# Patient Record
Sex: Male | Born: 1965 | Race: White | Hispanic: No | Marital: Married | Smoking: Never smoker
Health system: Southern US, Community
[De-identification: ages and names within clinical notes are randomized; demographics above are authoritative.]

## PROBLEM LIST (undated history)

## (undated) DIAGNOSIS — E785 Hyperlipidemia, unspecified: Secondary | ICD-10-CM

## (undated) DIAGNOSIS — I10 Essential (primary) hypertension: Secondary | ICD-10-CM

## (undated) HISTORY — PX: TESTICLE TORSION REDUCTION: SHX795

## (undated) HISTORY — DX: Hyperlipidemia, unspecified: E78.5

## (undated) HISTORY — DX: Essential (primary) hypertension: I10

---

## 2000-11-14 ENCOUNTER — Other Ambulatory Visit: Admission: RE | Admit: 2000-11-14 | Discharge: 2000-11-14 | Payer: Self-pay | Admitting: Urology

## 2004-12-06 ENCOUNTER — Encounter: Admission: RE | Admit: 2004-12-06 | Discharge: 2004-12-06 | Payer: Self-pay | Admitting: Family Medicine

## 2016-04-24 DIAGNOSIS — I129 Hypertensive chronic kidney disease with stage 1 through stage 4 chronic kidney disease, or unspecified chronic kidney disease: Secondary | ICD-10-CM | POA: Diagnosis not present

## 2016-04-24 DIAGNOSIS — E291 Testicular hypofunction: Secondary | ICD-10-CM | POA: Diagnosis not present

## 2016-04-24 DIAGNOSIS — K219 Gastro-esophageal reflux disease without esophagitis: Secondary | ICD-10-CM | POA: Diagnosis not present

## 2016-04-24 DIAGNOSIS — E7800 Pure hypercholesterolemia, unspecified: Secondary | ICD-10-CM | POA: Diagnosis not present

## 2016-04-24 DIAGNOSIS — N183 Chronic kidney disease, stage 3 (moderate): Secondary | ICD-10-CM | POA: Diagnosis not present

## 2016-11-25 DIAGNOSIS — E7800 Pure hypercholesterolemia, unspecified: Secondary | ICD-10-CM | POA: Diagnosis not present

## 2016-11-25 DIAGNOSIS — E291 Testicular hypofunction: Secondary | ICD-10-CM | POA: Diagnosis not present

## 2016-11-25 DIAGNOSIS — I129 Hypertensive chronic kidney disease with stage 1 through stage 4 chronic kidney disease, or unspecified chronic kidney disease: Secondary | ICD-10-CM | POA: Diagnosis not present

## 2016-11-25 DIAGNOSIS — Z0000 Encounter for general adult medical examination without abnormal findings: Secondary | ICD-10-CM | POA: Diagnosis not present

## 2016-11-25 DIAGNOSIS — Z125 Encounter for screening for malignant neoplasm of prostate: Secondary | ICD-10-CM | POA: Diagnosis not present

## 2017-01-27 DIAGNOSIS — L259 Unspecified contact dermatitis, unspecified cause: Secondary | ICD-10-CM | POA: Diagnosis not present

## 2017-05-30 DIAGNOSIS — E7800 Pure hypercholesterolemia, unspecified: Secondary | ICD-10-CM | POA: Diagnosis not present

## 2017-05-30 DIAGNOSIS — N183 Chronic kidney disease, stage 3 (moderate): Secondary | ICD-10-CM | POA: Diagnosis not present

## 2017-05-30 DIAGNOSIS — K219 Gastro-esophageal reflux disease without esophagitis: Secondary | ICD-10-CM | POA: Diagnosis not present

## 2017-05-30 DIAGNOSIS — I129 Hypertensive chronic kidney disease with stage 1 through stage 4 chronic kidney disease, or unspecified chronic kidney disease: Secondary | ICD-10-CM | POA: Diagnosis not present

## 2017-09-11 DIAGNOSIS — J069 Acute upper respiratory infection, unspecified: Secondary | ICD-10-CM | POA: Diagnosis not present

## 2017-10-22 DIAGNOSIS — J111 Influenza due to unidentified influenza virus with other respiratory manifestations: Secondary | ICD-10-CM | POA: Diagnosis not present

## 2017-10-22 DIAGNOSIS — R509 Fever, unspecified: Secondary | ICD-10-CM | POA: Diagnosis not present

## 2017-12-25 DIAGNOSIS — Z125 Encounter for screening for malignant neoplasm of prostate: Secondary | ICD-10-CM | POA: Diagnosis not present

## 2017-12-25 DIAGNOSIS — E7800 Pure hypercholesterolemia, unspecified: Secondary | ICD-10-CM | POA: Diagnosis not present

## 2017-12-25 DIAGNOSIS — E291 Testicular hypofunction: Secondary | ICD-10-CM | POA: Diagnosis not present

## 2017-12-25 DIAGNOSIS — I129 Hypertensive chronic kidney disease with stage 1 through stage 4 chronic kidney disease, or unspecified chronic kidney disease: Secondary | ICD-10-CM | POA: Diagnosis not present

## 2017-12-25 DIAGNOSIS — N183 Chronic kidney disease, stage 3 (moderate): Secondary | ICD-10-CM | POA: Diagnosis not present

## 2017-12-25 DIAGNOSIS — Z0000 Encounter for general adult medical examination without abnormal findings: Secondary | ICD-10-CM | POA: Diagnosis not present

## 2018-06-29 ENCOUNTER — Other Ambulatory Visit: Payer: Self-pay | Admitting: Family Medicine

## 2018-06-29 DIAGNOSIS — K219 Gastro-esophageal reflux disease without esophagitis: Secondary | ICD-10-CM | POA: Diagnosis not present

## 2018-06-29 DIAGNOSIS — E0789 Other specified disorders of thyroid: Secondary | ICD-10-CM

## 2018-06-29 DIAGNOSIS — E7800 Pure hypercholesterolemia, unspecified: Secondary | ICD-10-CM | POA: Diagnosis not present

## 2018-06-29 DIAGNOSIS — Z23 Encounter for immunization: Secondary | ICD-10-CM | POA: Diagnosis not present

## 2018-06-29 DIAGNOSIS — N183 Chronic kidney disease, stage 3 (moderate): Secondary | ICD-10-CM | POA: Diagnosis not present

## 2018-06-29 DIAGNOSIS — I129 Hypertensive chronic kidney disease with stage 1 through stage 4 chronic kidney disease, or unspecified chronic kidney disease: Secondary | ICD-10-CM | POA: Diagnosis not present

## 2018-07-10 ENCOUNTER — Ambulatory Visit
Admission: RE | Admit: 2018-07-10 | Discharge: 2018-07-10 | Disposition: A | Discharge status: Home / Self Care | Payer: BLUE CROSS/BLUE SHIELD | Source: Ambulatory Visit | Attending: Family Medicine | Admitting: Family Medicine

## 2018-07-10 DIAGNOSIS — E010 Iodine-deficiency related diffuse (endemic) goiter: Secondary | ICD-10-CM | POA: Diagnosis not present

## 2018-07-10 DIAGNOSIS — E042 Nontoxic multinodular goiter: Secondary | ICD-10-CM | POA: Diagnosis not present

## 2018-07-10 DIAGNOSIS — E0789 Other specified disorders of thyroid: Secondary | ICD-10-CM

## 2018-07-22 ENCOUNTER — Other Ambulatory Visit: Payer: Self-pay | Admitting: Family Medicine

## 2018-07-22 DIAGNOSIS — R9389 Abnormal findings on diagnostic imaging of other specified body structures: Secondary | ICD-10-CM

## 2018-08-18 ENCOUNTER — Ambulatory Visit
Admission: RE | Admit: 2018-08-18 | Discharge: 2018-08-18 | Disposition: A | Discharge status: Home / Self Care | Payer: BLUE CROSS/BLUE SHIELD | Source: Ambulatory Visit | Attending: Family Medicine | Admitting: Family Medicine

## 2018-08-18 ENCOUNTER — Other Ambulatory Visit (HOSPITAL_COMMUNITY)
Admission: RE | Admit: 2018-08-18 | Discharge: 2018-08-18 | Disposition: A | Discharge status: Home / Self Care | Payer: BLUE CROSS/BLUE SHIELD | Source: Ambulatory Visit | Attending: Radiology | Admitting: Radiology

## 2018-08-18 DIAGNOSIS — E041 Nontoxic single thyroid nodule: Secondary | ICD-10-CM | POA: Insufficient documentation

## 2018-08-18 DIAGNOSIS — E042 Nontoxic multinodular goiter: Secondary | ICD-10-CM | POA: Diagnosis not present

## 2018-08-18 DIAGNOSIS — R9389 Abnormal findings on diagnostic imaging of other specified body structures: Secondary | ICD-10-CM

## 2018-10-19 DIAGNOSIS — E291 Testicular hypofunction: Secondary | ICD-10-CM | POA: Diagnosis not present

## 2019-03-08 DIAGNOSIS — Z6831 Body mass index (BMI) 31.0-31.9, adult: Secondary | ICD-10-CM | POA: Diagnosis not present

## 2019-03-08 DIAGNOSIS — I129 Hypertensive chronic kidney disease with stage 1 through stage 4 chronic kidney disease, or unspecified chronic kidney disease: Secondary | ICD-10-CM | POA: Diagnosis not present

## 2019-03-08 DIAGNOSIS — Z0000 Encounter for general adult medical examination without abnormal findings: Secondary | ICD-10-CM | POA: Diagnosis not present

## 2019-03-08 DIAGNOSIS — L570 Actinic keratosis: Secondary | ICD-10-CM | POA: Diagnosis not present

## 2019-03-08 DIAGNOSIS — Z8249 Family history of ischemic heart disease and other diseases of the circulatory system: Secondary | ICD-10-CM | POA: Diagnosis not present

## 2019-03-08 DIAGNOSIS — E7800 Pure hypercholesterolemia, unspecified: Secondary | ICD-10-CM | POA: Diagnosis not present

## 2019-03-08 DIAGNOSIS — Z125 Encounter for screening for malignant neoplasm of prostate: Secondary | ICD-10-CM | POA: Diagnosis not present

## 2019-05-19 DIAGNOSIS — Z20828 Contact with and (suspected) exposure to other viral communicable diseases: Secondary | ICD-10-CM | POA: Diagnosis not present

## 2019-06-22 DIAGNOSIS — R509 Fever, unspecified: Secondary | ICD-10-CM | POA: Diagnosis not present

## 2019-06-22 DIAGNOSIS — R05 Cough: Secondary | ICD-10-CM | POA: Diagnosis not present

## 2019-09-07 DIAGNOSIS — E7800 Pure hypercholesterolemia, unspecified: Secondary | ICD-10-CM | POA: Diagnosis not present

## 2019-09-07 DIAGNOSIS — I129 Hypertensive chronic kidney disease with stage 1 through stage 4 chronic kidney disease, or unspecified chronic kidney disease: Secondary | ICD-10-CM | POA: Diagnosis not present

## 2019-09-07 DIAGNOSIS — K219 Gastro-esophageal reflux disease without esophagitis: Secondary | ICD-10-CM | POA: Diagnosis not present

## 2019-09-07 DIAGNOSIS — N183 Chronic kidney disease, stage 3 unspecified: Secondary | ICD-10-CM | POA: Diagnosis not present

## 2019-09-13 DIAGNOSIS — E7800 Pure hypercholesterolemia, unspecified: Secondary | ICD-10-CM | POA: Diagnosis not present

## 2019-09-13 DIAGNOSIS — I129 Hypertensive chronic kidney disease with stage 1 through stage 4 chronic kidney disease, or unspecified chronic kidney disease: Secondary | ICD-10-CM | POA: Diagnosis not present

## 2019-09-13 DIAGNOSIS — E291 Testicular hypofunction: Secondary | ICD-10-CM | POA: Diagnosis not present

## 2019-09-24 DIAGNOSIS — R05 Cough: Secondary | ICD-10-CM | POA: Diagnosis not present

## 2019-09-24 DIAGNOSIS — U071 COVID-19: Secondary | ICD-10-CM | POA: Diagnosis not present

## 2019-09-24 DIAGNOSIS — R52 Pain, unspecified: Secondary | ICD-10-CM | POA: Diagnosis not present

## 2020-03-09 DIAGNOSIS — I129 Hypertensive chronic kidney disease with stage 1 through stage 4 chronic kidney disease, or unspecified chronic kidney disease: Secondary | ICD-10-CM | POA: Diagnosis not present

## 2020-03-09 DIAGNOSIS — Z0000 Encounter for general adult medical examination without abnormal findings: Secondary | ICD-10-CM | POA: Diagnosis not present

## 2020-03-09 DIAGNOSIS — Z125 Encounter for screening for malignant neoplasm of prostate: Secondary | ICD-10-CM | POA: Diagnosis not present

## 2020-03-09 DIAGNOSIS — E7800 Pure hypercholesterolemia, unspecified: Secondary | ICD-10-CM | POA: Diagnosis not present

## 2020-04-06 DIAGNOSIS — M7021 Olecranon bursitis, right elbow: Secondary | ICD-10-CM | POA: Diagnosis not present

## 2020-09-19 DIAGNOSIS — N183 Chronic kidney disease, stage 3 unspecified: Secondary | ICD-10-CM | POA: Diagnosis not present

## 2020-09-19 DIAGNOSIS — E7800 Pure hypercholesterolemia, unspecified: Secondary | ICD-10-CM | POA: Diagnosis not present

## 2020-09-19 DIAGNOSIS — K219 Gastro-esophageal reflux disease without esophagitis: Secondary | ICD-10-CM | POA: Diagnosis not present

## 2020-09-19 DIAGNOSIS — I129 Hypertensive chronic kidney disease with stage 1 through stage 4 chronic kidney disease, or unspecified chronic kidney disease: Secondary | ICD-10-CM | POA: Diagnosis not present

## 2020-12-12 DIAGNOSIS — I129 Hypertensive chronic kidney disease with stage 1 through stage 4 chronic kidney disease, or unspecified chronic kidney disease: Secondary | ICD-10-CM | POA: Diagnosis not present

## 2021-01-12 ENCOUNTER — Other Ambulatory Visit: Payer: Self-pay | Admitting: Family Medicine

## 2021-01-12 DIAGNOSIS — I129 Hypertensive chronic kidney disease with stage 1 through stage 4 chronic kidney disease, or unspecified chronic kidney disease: Secondary | ICD-10-CM

## 2021-01-18 ENCOUNTER — Other Ambulatory Visit: Payer: Self-pay

## 2021-01-18 ENCOUNTER — Ambulatory Visit
Admission: RE | Admit: 2021-01-18 | Discharge: 2021-01-18 | Disposition: A | Discharge status: Home / Self Care | Payer: BLUE CROSS/BLUE SHIELD | Source: Ambulatory Visit | Attending: Family Medicine | Admitting: Family Medicine

## 2021-01-18 DIAGNOSIS — I10 Essential (primary) hypertension: Secondary | ICD-10-CM | POA: Diagnosis not present

## 2021-01-18 DIAGNOSIS — N2889 Other specified disorders of kidney and ureter: Secondary | ICD-10-CM | POA: Diagnosis not present

## 2021-01-18 DIAGNOSIS — I129 Hypertensive chronic kidney disease with stage 1 through stage 4 chronic kidney disease, or unspecified chronic kidney disease: Secondary | ICD-10-CM

## 2021-01-22 ENCOUNTER — Other Ambulatory Visit: Payer: Self-pay | Admitting: Family Medicine

## 2021-01-22 DIAGNOSIS — R93429 Abnormal radiologic findings on diagnostic imaging of unspecified kidney: Secondary | ICD-10-CM

## 2021-02-04 ENCOUNTER — Ambulatory Visit
Admission: RE | Admit: 2021-02-04 | Discharge: 2021-02-04 | Disposition: A | Discharge status: Home / Self Care | Payer: BC Managed Care – PPO | Source: Ambulatory Visit | Attending: Family Medicine | Admitting: Family Medicine

## 2021-02-04 ENCOUNTER — Other Ambulatory Visit: Payer: Self-pay

## 2021-02-04 DIAGNOSIS — R93429 Abnormal radiologic findings on diagnostic imaging of unspecified kidney: Secondary | ICD-10-CM

## 2021-02-04 DIAGNOSIS — N281 Cyst of kidney, acquired: Secondary | ICD-10-CM | POA: Diagnosis not present

## 2021-02-04 MED ORDER — GADOBENATE DIMEGLUMINE 529 MG/ML IV SOLN
20.0000 mL | Freq: Once | INTRAVENOUS | Status: AC | PRN
  Administered 2021-02-04: 20 mL via INTRAVENOUS

## 2021-02-26 DIAGNOSIS — I129 Hypertensive chronic kidney disease with stage 1 through stage 4 chronic kidney disease, or unspecified chronic kidney disease: Secondary | ICD-10-CM | POA: Diagnosis not present

## 2021-09-11 ENCOUNTER — Encounter (HOSPITAL_BASED_OUTPATIENT_CLINIC_OR_DEPARTMENT_OTHER): Payer: Self-pay | Admitting: Family Medicine

## 2021-09-20 ENCOUNTER — Encounter (HOSPITAL_BASED_OUTPATIENT_CLINIC_OR_DEPARTMENT_OTHER): Payer: Self-pay | Admitting: Cardiovascular Disease

## 2021-09-20 ENCOUNTER — Ambulatory Visit (HOSPITAL_BASED_OUTPATIENT_CLINIC_OR_DEPARTMENT_OTHER): Payer: BLUE CROSS/BLUE SHIELD | Admitting: Cardiovascular Disease

## 2021-09-20 ENCOUNTER — Other Ambulatory Visit: Payer: Self-pay

## 2021-09-20 VITALS — BP 160/98 | HR 57 | Ht 73.0 in | Wt 250.4 lb

## 2021-09-20 DIAGNOSIS — I10 Essential (primary) hypertension: Secondary | ICD-10-CM | POA: Diagnosis not present

## 2021-09-20 DIAGNOSIS — G2581 Restless legs syndrome: Secondary | ICD-10-CM | POA: Diagnosis not present

## 2021-09-20 DIAGNOSIS — I998 Other disorder of circulatory system: Secondary | ICD-10-CM | POA: Diagnosis not present

## 2021-09-20 DIAGNOSIS — E6609 Other obesity due to excess calories: Secondary | ICD-10-CM | POA: Diagnosis not present

## 2021-09-20 DIAGNOSIS — E7800 Pure hypercholesterolemia, unspecified: Secondary | ICD-10-CM | POA: Insufficient documentation

## 2021-09-20 DIAGNOSIS — E669 Obesity, unspecified: Secondary | ICD-10-CM

## 2021-09-20 HISTORY — DX: Restless legs syndrome: G25.81

## 2021-09-20 HISTORY — DX: Obesity, unspecified: E66.9

## 2021-09-20 MED ORDER — HYDRALAZINE HCL 50 MG PO TABS: 50.0000 mg | ORAL_TABLET | Freq: Two times a day (BID) | ORAL | 3 refills | Status: DC

## 2021-09-20 MED ORDER — CARVEDILOL 12.5 MG PO TABS: 12.5000 mg | ORAL_TABLET | Freq: Two times a day (BID) | ORAL | 3 refills | Status: DC

## 2021-09-20 NOTE — Assessment & Plan Note (Addendum)
Blood pressures been poorly controlled in the last year.  We discussed the fact that there are many things that may be contributing to this.  He has been under a lot of stress from his father having a stroke and having Alzheimer's.  He has also gained about 20 pounds over this time frame.  He has not been getting much exercise.  He is going to work on increasing his exercise back to at least 150 minutes weekly.  We did discuss the PREP program at the [facility] but he is not interested at this time.  He has struggled with intolerance to many medicines.  He does tolerate metoprolol.  We will try switching this to carvedilol 12.5 mg twice daily to see if this helps better control his blood pressure.  Fortunately his blood pressure seems to be not quite as high at home as it is in the office.  We will ask him to keep tracking his blood pressures twice daily and bring his machine and his recordings back to follow-up.  Increase hydralazine to 50 mg twice daily.  We did discuss that he maintain his sodium intake to less than 2500 mg daily.  We will give him an advance hypertension clinic educational handbook with more information as well.  He is not interested in the remote patient monitoring program.  Check a TSH.  The last one I see documented is from 2019.  He does have a significant blood pressure differential in his arms.  He does not seem to have any symptoms of obstruction.  We will get carotid Dopplers to better assess for any evidence of subclavian stenosis.

## 2021-09-20 NOTE — Progress Notes (Signed)
Advanced Hypertension Clinic Initial Assessment:    Date:  09/20/2021   ID:  Fernando Castro, DOB 14-Aug-1966, MRN [id number]  PCP:  Blair HeysEhinger, Robert, MD  Cardiologist:  None  Nephrologist:  Referring MD: Blair HeysEhinger, Robert, MD   CC: Hypertension  History of Present Illness:    Fernando SanerRussell L Castro is a 56 y.o. male with a hx of hypertension, hyperlipidemia, and restless legs, here to establish care in the Advanced Hypertension Clinic. He saw Dr. Manus GunningEhinger on 06/2021 and his blood pressure was initially 142/78, and increased to 170/100 on repeat. At that time he was taking metoprolol and hydralazine. He has allergies to lisinopril, valsartan, amlodipine, doxazosin, HCTZ, pravastatin, and simvastatin. He had renal dopplers that were negative for renal artery stenosis 01/2021. He was referred to [facility]pertension Clinic for further management.  Today, he reports that his blood pressure has been gradually worsening since the summer before last. Previously his blood pressure had been controlled on medication for 10+ years in the 130s/80s. Lately, his blood pressure has been averaging in the 150s/90s at home. Major life stressors include a COVID infection 2 years ago, and his father suffering a stroke about the same time. In that same year, he also was working in a building in summer, and he became overheated and lightheaded. At some point he passed out, as the next thing he remembers was being on his hands and knees. Now he tries to stay hydrated, and has not had any recurrent episodes. Since then he has been less active, and gained some weight. Usually he ranged from 220-230 lbs, but is 250 today. On occasion he walks around his neighborhood. He feels a little winded when he first starts walking, but this improves as he continues to exercise. He used to exercise 3-5 times a week at [employer]. In 06/2021 he was at the beach, and noticed atypical LE edema. He wore compression socks for a few days and his  edema resolved. Edema will also be noticeable when riding in a car for a long period, but will improve by the next morning. The other day he noticed discomfort in his left shoulder, as if he may start feeling tingling or numbness. They cook a lot of meals at home, and occasionally order out. He tries to avoid adding salt to his diet. He drinks coffee every morning as well as a soft drink later in the day. Also, he takes Claritin daily to help his allergies. He endorses mild snoring, and feels well-rested in the morning most of the time. His wife tells him that he becomes "jumpy" at night, concerning his restless legs. He denies any palpitations, chest pain, headaches, orthopnea, or PND.  Previous antihypertensives: Lisinopril Valsartan Amlodipine Doxazosin HCTZ  Past Medical History:  Diagnosis Date   Hyperlipidemia    Hypertension    Obesity 09/20/2021   RLS (restless legs syndrome) 09/20/2021    Past Surgical History:  Procedure Laterality Date   TESTICLE TORSION REDUCTION      Current Medications: Current Meds  Medication Sig   testosterone (TESTIM) 50 MG/5GM (1%) GEL 1 packet to skin in the morning to shoulder, upper arms or abdomen     Allergies:   Amlodipine, Hydrochlorothiazide, Lisinopril, Pravastatin, Simvastatin, and Valsartan   Social History   Socioeconomic History   Marital status: Married    Spouse name: Not on file   Number of children: Not on file   Years of education: Not on file   Highest education level: Not  on file  Occupational History   Not on file  Tobacco Use   Smoking status: Never   Smokeless tobacco: Never  Substance and Sexual Activity   Alcohol use: Yes    Comment: Occasionally   Drug use: Never   Sexual activity: Not on file  Other Topics Concern   Not on file  Social History Narrative   Not on file   Social Determinants of Health   Financial Resource Strain: Low Risk    Difficulty of Paying Living Expenses: Not hard at all  Food  Insecurity: No Food Insecurity   Worried About Running Out of Food in the Last Year: Never true   Ran Out of Food in the Last Year: Never true  Transportation Needs: No Transportation Needs   Lack of Transportation (Medical): No   Lack of Transportation (Non-Medical): No  Physical Activity: Inactive   Days of Exercise per Week: 0 days   Minutes of Exercise per Session: 0 min  Stress: Not on file  Social Connections: Not on file     Family History: The patient's family history includes Heart disease in his paternal grandfather; Hyperlipidemia in his brother and mother; Hypertension in his brother and mother; Stroke in his father.  ROS:   Please see the history of present illness.    (+) Exertional shortness of breath (+) LE edema (+) Left shoulder discomfort (+) Snoring (+) Restless legs All other systems reviewed and are negative.  EKGs/Labs/Other Studies Reviewed:    Renal Artery Duplex 01/18/2021: COMPARISON:  Abdominal ultrasound-09/26/2010   FINDINGS: Right Kidney:   Normal cortical thickness, echogenicity and size, measuring 12.4 cm in length. There is an approximately 2.1 x 1.8 x 2.0 cm hypoechoic partially exophytic lesion arising from the superior pole of the right kidney (images 45 - 49), not definitely seen on remote abdominal ultrasound performed in 2012 and cannot be characterized as a simple renal cyst. Additionally, there is an apparent 1.4 cm echogenic nodule within the interpolar aspect of the right kidney (images 50 - 52) which may represent interposed parapelvic fat though conceivably an angiomyolipoma could have a similar appearance. Note is made of an approximately 2.2 cm anechoic lesion involving the inferior pole of the right kidney which is favored to represent a renal cyst (images 71 through 73). No echogenic renal stones. No urinary obstruction.   Left Kidney:   Normal cortical thickness, echogenicity and size, measuring 13.3 cm in length. Note  is made of an approximately 1.8 x 1.8 x 1.2 cm partially exophytic anechoic cyst involving the inferior pole the left kidney (images 112 through 113). No echogenic renal stones. No urinary obstruction.   Bladder: There is apparent mass effect of the prostate gland upon the undersurface of the urinary bladder (images 114 and 119).   _________________________________________________________   RENAL DUPLEX ULTRASOUND   IMPRESSION: 1. No evidence of renal artery stenosis, asymmetric renal atrophy or urinary obstruction. 2. Indeterminate right-sided renal lesions, not definitely seen on remote abdominal ultrasound performed 09/2010. Correlation with more recent prior outside examinations (if available), is advised. Otherwise, further evaluation could be performed with dedicated renal MRI as indicated. 3. Potential mass effect of the prostate gland upon the undersurface of the urinary bladder. If not recently performed, DRE is advised. 4. Suspected hepatic steatosis.  Correlation with LFTs is advised.  EKG:   09/20/2021: Sinus bradycardia. Rate 57 bpm.  Recent Labs: No results found for requested labs within last 8760 hours.   Recent Lipid Panel No results  found for: CHOL, TRIG, HDL, CHOLHDL, VLDL, LDLCALC, LDLDIRECT  Physical Exam:    VS:  BP (!) 160/98 (BP Location: Right Arm, Patient Position: Sitting, Cuff Size: Large)    Pulse (!) 57    Ht 6\' 1"  (1.854 m)    Wt 250 lb 6.4 oz (113.6 kg)    BMI 33.04 kg/m  , BMI Body mass index is 33.04 kg/m. GENERAL:  Well appearing HEENT: Pupils equal round and reactive, fundi not visualized, oral mucosa unremarkable NECK:  No jugular venous distention, waveform within normal limits, carotid upstroke brisk and symmetric, no bruits, no thyromegaly LUNGS:  Clear to auscultation bilaterally HEART:  RRR.  PMI not displaced or sustained,S1 and S2 within normal limits, no S3, no S4, no clicks, no rubs, no murmurs ABD:  Flat, positive bowel sounds  normal in frequency in pitch, no bruits, no rebound, no guarding, no midline pulsatile mass, no hepatomegaly, no splenomegaly EXT:  1 plus pulses throughout, no edema, no cyanosis no clubbing SKIN:  No rashes no nodules NEURO:  Cranial nerves II through XII grossly intact, motor grossly intact throughout PSYCH:  Cognitively intact, oriented to person place and time   ASSESSMENT/PLAN:    Essential hypertension Blood pressures been poorly controlled in the last year.  We discussed the fact that there are many things that may be contributing to this.  He has been under a lot of stress from his father having a stroke and having Alzheimer's.  He has also gained about 20 pounds over this time frame.  He has not been getting much exercise.  He is going to work on increasing his exercise back to at least 150 minutes weekly.  We [facility] but he is not interested at this time.  He has struggled with intolerance to many medicines.  He does tolerate metoprolol.  We will try switching this to carvedilol 12.5 mg twice daily to see if this helps better control his blood pressure.  Fortunately his blood pressure seems to be not quite as high at home as it is in the office.  We will ask him to keep tracking his blood pressures twice daily and bring his machine and his recordings back to follow-up.  Increase hydralazine to 50 mg twice daily.  We did discuss that he maintain his sodium intake to less than 2500 mg daily.  We will give him an advance hypertension clinic educational handbook with more information as well.  He is not interested in the remote patient monitoring program.  Check a TSH.  The last one I see documented is from 2019.  He does have a significant blood pressure differential in his arms.  He does not seem to have any symptoms of obstruction.  We will get carotid Dopplers to better assess for any evidence of subclavian stenosis.   Screening for Secondary Hypertension:   Causes 09/20/2021  Drugs/Herbals Screened     - Comments Will work on limiting sodium intake.  2 caffeinated drinks daily. Androgel  Renovascular HTN Screened  Sleep Apnea Screened     - Comments no symptoms  Thyroid Disease Screened     - Comments check TSH  Hyperaldosteronism N/A  Pheochromocytoma N/A  Cushing's Syndrome N/A  Hyperparathyroidism N/A  Coarctation of the Aorta Screened     - Comments BP asymetric.  Check carotid Dopplers  Compliance Screened    Relevant Labs/Studies:     Disposition:    FU with APP/PharmD in 1  month.   FU with Fernando Minjares C. Duke Salvia, MD, [facility] in 4 months.   Medication Adjustments/Labs and Tests Ordered: Current medicines are reviewed at length with the patient today.  Concerns regarding medicines are outlined above.   No orders of the defined types were placed in this encounter.  No orders of the defined types were placed in this encounter.  I,Mathew Stumpf,acting as a Neurosurgeon for Chilton Si, MD.,have documented all relevant documentation on the behalf of Chilton Si, MD,as directed by  Chilton Si, MD while in the presence of Chilton Si, MD.  I, Fernando Negron C. Duke Salvia, MD have reviewed all documentation for this visit.  The documentation of the exam, diagnosis, procedures, and orders on 09/20/2021 are all accurate and complete.   Signed, Chilton Si, MD  09/20/2021 4:27 PM    [facility]

## 2021-09-20 NOTE — Patient Instructions (Signed)
Medication Instructions:  STOP METOPROLOL   START CARVEDILOL 12.5 MG TWICE A DAY   INCREASE HYDRALAZINE TO 50 MG TWICE A DAY    Labwork: TSH SOON    Testing/Procedures: Your physician has requested that you have a carotid duplex. This test is an ultrasound of the carotid arteries in your neck. It looks at blood flow through these arteries that supply the brain with blood. Allow one hour for this exam. There are no restrictions or special instructions.   Follow-Up: 1 MONTH WITH PHARM D    Special Instructions:   MONITOR YOUR BLOOD PRESSURE TWICE A DAY, LOG IN THE BOOK PROVIDED. BRING THE BOOK AND YOUR BLOOD PRESSURE MACHINE TO YOUR FOLLOW UP IN 1 MONTH   DASH Eating Plan DASH stands for "Dietary Approaches to Stop Hypertension." The DASH eating plan is a healthy eating plan that has been shown to reduce high blood pressure (hypertension). It may also reduce your risk for type 2 diabetes, heart disease, and stroke. The DASH eating plan may also help with weight loss. What are tips for following this plan?  General guidelines Avoid eating more than 2,300 mg (milligrams) of salt (sodium) a day. If you have hypertension, you may need to reduce your sodium intake to 1,500 mg a day. Limit alcohol intake to no more than 1 drink a day for nonpregnant women and 2 drinks a day for men. One drink equals 12 oz of beer, 5 oz of wine, or 1 oz of hard liquor. Work with your health care provider to maintain a healthy body weight or to lose weight. Ask what an ideal weight is for you. Get at least 30 minutes of exercise that causes your heart to beat faster (aerobic exercise) most days of the week. Activities may include walking, swimming, or biking. Work with your health care provider or diet and nutrition specialist (dietitian) to adjust your eating plan to your individual calorie needs. Reading food labels  Check food labels for the amount of sodium per serving. Choose foods with less than 5  percent of the Daily Value of sodium. Generally, foods with less than 300 mg of sodium per serving fit into this eating plan. To find whole grains, look for the word "whole" as the first word in the ingredient list. Shopping Buy products labeled as "low-sodium" or "no salt added." Buy fresh foods. Avoid canned foods and premade or frozen meals. Cooking Avoid adding salt when cooking. Use salt-free seasonings or herbs instead of table salt or sea salt. Check with your health care provider or pharmacist before using salt substitutes. Do not fry foods. Cook foods using healthy methods such as baking, boiling, grilling, and broiling instead. Cook with heart-healthy oils, such as olive, canola, soybean, or sunflower oil. Meal planning Eat a balanced diet that includes: 5 or more servings of fruits and vegetables each day. At each meal, try to fill half of your plate with fruits and vegetables. Up to 6-8 servings of whole grains each day. Less than 6 oz of lean meat, poultry, or fish each day. A 3-oz serving of meat is about the same size as a deck of cards. One egg equals 1 oz. 2 servings of low-fat dairy each day. A serving of nuts, seeds, or beans 5 times each week. Heart-healthy fats. Healthy fats called Omega-3 fatty acids are found in foods such as flaxseeds and coldwater fish, like sardines, salmon, and mackerel. Limit how much you eat of the following: Canned or prepackaged foods. Food  that is high in trans fat, such as fried foods. Food that is high in saturated fat, such as fatty meat. Sweets, desserts, sugary drinks, and other foods with added sugar. Full-fat dairy products. Do not salt foods before eating. Try to eat at least 2 vegetarian meals each week. Eat more home-cooked food and less restaurant, buffet, and fast food. When eating at a restaurant, ask that your food be prepared with less salt or no salt, if possible. What foods are recommended? The items listed may not be a  complete list. Talk with your dietitian about what dietary choices are best for you. Grains Whole-grain or whole-wheat bread. Whole-grain or whole-wheat pasta. Brown rice. Orpah Cobb. Bulgur. Whole-grain and low-sodium cereals. Pita bread. Low-fat, low-sodium crackers. Whole-wheat flour tortillas. Vegetables Fresh or frozen vegetables (raw, steamed, roasted, or grilled). Low-sodium or reduced-sodium tomato and vegetable juice. Low-sodium or reduced-sodium tomato sauce and tomato paste. Low-sodium or reduced-sodium canned vegetables. Fruits All fresh, dried, or frozen fruit. Canned fruit in natural juice (without added sugar). Meat and other protein foods Skinless chicken or [place]. Ground chicken or [place]. Pork with fat trimmed off. Fish and seafood. Egg whites. Dried beans, peas, or lentils. Unsalted nuts, nut butters, and seeds. Unsalted canned beans. Lean cuts of beef with fat trimmed off. Low-sodium, lean deli meat. Dairy Low-fat (1%) or fat-free (skim) milk. Fat-free, low-fat, or reduced-fat cheeses. Nonfat, low-sodium ricotta or cottage cheese. Low-fat or nonfat yogurt. Low-fat, low-sodium cheese. Fats and oils Soft margarine without trans fats. Vegetable oil. Low-fat, reduced-fat, or light mayonnaise and salad dressings (reduced-sodium). Canola, safflower, olive, soybean, and sunflower oils. Avocado. Seasoning and other foods Herbs. Spices. Seasoning mixes without salt. Unsalted popcorn and pretzels. Fat-free sweets. What foods are not recommended? The items listed may not be a complete list. Talk with your dietitian about what dietary choices are best for you. Grains Baked goods made with fat, such as croissants, muffins, or some breads. Dry pasta or rice meal packs. Vegetables Creamed or fried vegetables. Vegetables in a cheese sauce. Regular canned vegetables (not low-sodium or reduced-sodium). Regular canned tomato sauce and paste (not low-sodium or reduced-sodium). Regular  tomato and vegetable juice (not low-sodium or reduced-sodium). Rosita Fire. Olives. Fruits Canned fruit in a light or heavy syrup. Fried fruit. Fruit in cream or butter sauce. Meat and other protein foods Fatty cuts of meat. Ribs. Fried meat. Tomasa Blase. Sausage. Bologna and other processed lunch meats. Salami. Fatback. Hotdogs. Bratwurst. Salted nuts and seeds. Canned beans with added salt. Canned or smoked fish. Whole eggs or egg yolks. Chicken or [place] with skin. Dairy Whole or 2% milk, cream, and half-and-half. Whole or full-fat cream cheese. Whole-fat or sweetened yogurt. Full-fat cheese. Nondairy creamers. Whipped toppings. Processed cheese and cheese spreads. Fats and oils Butter. Stick margarine. Lard. Shortening. Ghee. Bacon fat. Tropical oils, such as coconut, palm kernel, or palm oil. Seasoning and other foods Salted popcorn and pretzels. Onion salt, garlic salt, seasoned salt, table salt, and sea salt. Worcestershire sauce. Tartar sauce. Barbecue sauce. Teriyaki sauce. Soy sauce, including reduced-sodium. Steak sauce. Canned and packaged gravies. Fish sauce. Oyster sauce. Cocktail sauce. Horseradish that you find on the shelf. Ketchup. Mustard. Meat flavorings and tenderizers. Bouillon cubes. Hot sauce and Tabasco sauce. Premade or packaged marinades. Premade or packaged taco seasonings. Relishes. Regular salad dressings. Where to find more information: National Heart, Lung, and Blood Institute: [employer].is American Heart Association: www.heart.org Summary The DASH eating plan is a healthy eating plan that has been shown to reduce high  blood pressure (hypertension). It may also reduce your risk for type 2 diabetes, heart disease, and stroke. With the DASH eating plan, you should limit salt (sodium) intake to 2,300 mg a day. If you have hypertension, you may need to reduce your sodium intake to 1,500 mg a day. When on the DASH eating plan, aim to eat more fresh fruits and vegetables, whole  grains, lean proteins, low-fat dairy, and heart-healthy fats. Work with your health care provider or diet and nutrition specialist (dietitian) to adjust your eating plan to your individual calorie needs. This information is not intended to replace advice given to you by your health care provider. Make sure you discuss any questions you have with your health care provider. Document Released: 08/22/2011 Document Revised: 08/15/2017 Document Reviewed: 08/26/2016 Elsevier Patient Education  2020 [employer].

## 2021-09-25 ENCOUNTER — Encounter (HOSPITAL_BASED_OUTPATIENT_CLINIC_OR_DEPARTMENT_OTHER): Payer: Self-pay

## 2021-10-18 ENCOUNTER — Ambulatory Visit (HOSPITAL_COMMUNITY)
Admission: RE | Admit: 2021-10-18 | Discharge: 2021-10-18 | Disposition: A | Discharge status: Home / Self Care | Payer: BLUE CROSS/BLUE SHIELD | Source: Ambulatory Visit | Attending: Cardiovascular Disease | Admitting: Cardiovascular Disease

## 2021-10-18 ENCOUNTER — Ambulatory Visit: Payer: BLUE CROSS/BLUE SHIELD | Admitting: Pharmacist Clinician (PhC)/ Clinical Pharmacy Specialist

## 2021-10-18 ENCOUNTER — Other Ambulatory Visit: Payer: Self-pay

## 2021-10-18 VITALS — BP 160/94 | HR 55 | Resp 14 | Ht 73.0 in | Wt 237.0 lb

## 2021-10-18 DIAGNOSIS — I10 Essential (primary) hypertension: Secondary | ICD-10-CM | POA: Diagnosis not present

## 2021-10-18 DIAGNOSIS — I998 Other disorder of circulatory system: Secondary | ICD-10-CM

## 2021-10-18 MED ORDER — OLMESARTAN MEDOXOMIL 5 MG PO TABS: 5.0000 mg | ORAL_TABLET | Freq: Every day | ORAL | 6 refills | Status: DC

## 2021-10-18 NOTE — Progress Notes (Signed)
10/22/2021 Fernando Castro 1966/02/05 [id number]   HPI:  Fernando Castro is a 56 y.o. male patient of Dr Oval Linsey, who presents today for hypertension clinic evaluation.  In addition to hypertension, his medical history is significant for hyperlipidemia.  He was seen by Dr. Oval Linsey last month after being referred from Dr. Marisue Humble.  His pressure that day was 160/98 and it was noted he was taking metoprolol and hydralazine.  He reported that his pressure started to increase gradually about 18 months ago, shortly after having Covid and dealing with his father having a stroke.  Chart also notes he has gained about 20 pounds since then.  Chart lists multiple drug allergies (see below), most of which are noted to cause a bump in serum creatinine.  Dr. Oval Linsey switched metoprolol to carvedilol and increased hydralazine to twice daily.  Patient had noted BP readings varying between left and right arms, so carotid dopplers were performed.  No indication of blockages in subclavian, vertebral or carotid arteries.  He returns today for follow up.  Is tolerating medications without concern and states compliant.    Blood Pressure Goal:  130/80  Current Medications:  carvedilol 12.5 mg bid, hydralazine 50 mg bid  Family Hx: mother now 30 with controlled hypertension otherwise healthy; father had stroke 2 years ago, in assisted living, no problems before this (surprise)  Social Hx: no, occasional, coffee home brewed, no soda since seeing TR  Diet: home cooked, no added salt, occasional veggie, plenty of fruits, balance of meats; no snacking ; diet since seeing TR.  Weight down  Exercise: walking some in neighborhood, and nwo gym for workout an treadmill  Intolerances:  amlodipine - edema; hctz, lisinopril, valsartan all noted to cause increase in SCr  Home BP readings:  home cuff just 85 weeks old  AM 24 readings average 139/91 (range 124-159/82-104) PM 25 readings average 142/91 (range  118-169/80-106)  Wt Readings from Last 3 Encounters:  10/18/21 237 lb (107.5 kg)  09/20/21 250 lb 6.4 oz (113.6 kg)   BP Readings from Last 3 Encounters:  10/18/21 (!) 160/94  09/20/21 (!) 160/98   Pulse Readings from Last 3 Encounters:  10/18/21 (!) 55  09/20/21 (!) 57    Current Outpatient Medications  Medication Sig Dispense Refill   atorvastatin (LIPITOR) 10 MG tablet Take 10 mg by mouth daily.     carvedilol (COREG) 12.5 MG tablet Take 1 tablet (12.5 mg total) by mouth 2 (two) times daily. 180 tablet 3   hydrALAZINE (APRESOLINE) 50 MG tablet Take 1 tablet (50 mg total) by mouth 2 (two) times daily. 180 tablet 3   loratadine (CLARITIN) 10 MG tablet Take 1 tablet by mouth daily.     Multiple Vitamins-Minerals (MULTI FOR HIM) TABS Take 1 tablet by mouth daily.     olmesartan (BENICAR) 5 MG tablet Take 1 tablet (5 mg total) by mouth daily. 30 tablet 6   testosterone (ANDROGEL) 50 MG/5GM (1%) GEL 1 packet to skin in the morning to shoulder, upper arms or abdomen     No current facility-administered medications for this visit.    Allergies  Allergen Reactions   Amlodipine     edema   Hydrochlorothiazide     Increase in creatinine   Lisinopril     Increase in creatinine   Pravastatin    Simvastatin    Valsartan     Increase in creatinine    Past Medical History:  Diagnosis Date   Hyperlipidemia  Hypertension    Obesity 09/20/2021   RLS (restless legs syndrome) 09/20/2021    Blood pressure (!) 160/94, pulse (!) 55, resp. rate 14, height 6\' 1"  (1.854 m), weight 237 lb (107.5 kg), SpO2 95 %.  Essential hypertension Patient with essential hypertension, with diastolic readings all consistently > 80, running as high as 106.  Discussed challenge of contraindicated medications.  He cannot recall any specific concerns in the past when SCr was elevated on medications.  Discussed possibility of trying a very low dose ARB and watching renal panel closely to see if he could  tolerate.  Patient agreeable to this, so will try olmesartan 5 mg daily.  He will repeat BMET in 10-14 days and we will monitor this closely.  He will return in 4-6 weeks for follow up.     Tommy Medal PharmD CPP [facility] [address] [place], [place] [address] [phone]

## 2021-10-18 NOTE — Patient Instructions (Signed)
Return for a a follow up appointment March 13 at 9:30 am  Go to the lab in 2 weeks to check kidney function (around Feb 16)  Take your BP meds as follows:  Start olmesartan 5 mg once daily  Continue with carvedilol and hydralazine  Bring all of your meds, your BP cuff and your record of home blood pressures to your next appointment.  Exercise as youre able, try to walk approximately 30 minutes per day.  Keep salt intake to a minimum, especially watch canned and prepared boxed foods.  Eat more fresh fruits and vegetables and fewer canned items.  Avoid eating in fast food restaurants.    HOW TO TAKE YOUR BLOOD PRESSURE: Rest 5 minutes before taking your blood pressure.  Dont smoke or drink caffeinated beverages for at least 30 minutes before. Take your blood pressure before (not after) you eat. Sit comfortably with your back supported and both feet on the floor (dont cross your legs). Elevate your arm to heart level on a table or a desk. Use the proper sized cuff. It should fit smoothly and snugly around your bare upper arm. There should be enough room to slip a fingertip under the cuff. The bottom edge of the cuff should be 1 inch above the crease of the elbow. Ideally, take 3 measurements at one sitting and record the average.

## 2021-10-19 LAB — TSH: TSH: 0.619 u[IU]/mL (ref 0.450–4.500)

## 2021-10-22 ENCOUNTER — Encounter: Payer: Self-pay | Admitting: Pharmacist Clinician (PhC)/ Clinical Pharmacy Specialist

## 2021-10-22 NOTE — Assessment & Plan Note (Signed)
Patient with essential hypertension, with diastolic readings all consistently > 80, running as high as 106.  Discussed challenge of contraindicated medications.  He cannot recall any specific concerns in the past when SCr was elevated on medications.  Discussed possibility of trying a very low dose ARB and watching renal panel closely to see if he could tolerate.  Patient agreeable to this, so will try olmesartan 5 mg daily.  He will repeat BMET in 10-14 days and we will monitor this closely.  He will return in 4-6 weeks for follow up.

## 2021-11-16 ENCOUNTER — Encounter (HOSPITAL_BASED_OUTPATIENT_CLINIC_OR_DEPARTMENT_OTHER): Payer: Self-pay

## 2021-11-26 ENCOUNTER — Other Ambulatory Visit: Payer: Self-pay

## 2021-11-26 ENCOUNTER — Ambulatory Visit (INDEPENDENT_AMBULATORY_CARE_PROVIDER_SITE_OTHER): Payer: BLUE CROSS/BLUE SHIELD | Admitting: Pharmacist Clinician (PhC)/ Clinical Pharmacy Specialist

## 2021-11-26 VITALS — BP 158/95 | HR 60 | Resp 17 | Ht 73.0 in | Wt 234.0 lb

## 2021-11-26 DIAGNOSIS — I10 Essential (primary) hypertension: Secondary | ICD-10-CM

## 2021-11-26 MED ORDER — OLMESARTAN MEDOXOMIL 5 MG PO TABS: 10.0000 mg | ORAL_TABLET | Freq: Every day | ORAL | 6 refills | Status: DC

## 2021-11-26 NOTE — Patient Instructions (Signed)
Return for a a follow up appointment April 24 at 2 pm ? ?Go to the lab in 2 weeks to check kidney function (if we stay on the olmesartan) ? ?Check your blood pressure at home daily and keep record of the readings. ? ?Take your BP meds as follows: ? Increase olmesartan to 10 mg daily ? Decrease hydralazine to 25 mg twice daily ? Continue carvedilol ? ?If you continue to have issues after 2 weeks, please reach out to me and we can consider another option.  Belenda Cruise at [phone]) ? ?Bring all of your meds, your BP cuff and your record of home blood pressures to your next appointment.  Exercise as you?re able, try to walk approximately 30 minutes per day.  Keep salt intake to a minimum, especially watch canned and prepared boxed foods.  Eat more fresh fruits and vegetables and fewer canned items.  Avoid eating in fast food restaurants.  ? ? HOW TO TAKE YOUR BLOOD PRESSURE: ?Rest 5 minutes before taking your blood pressure. ? Don?t smoke or drink caffeinated beverages for at least 30 minutes before. ?Take your blood pressure before (not after) you eat. ?Sit comfortably with your back supported and both feet on the floor (don?t cross your legs). ?Elevate your arm to heart level on a table or a desk. ?Use the proper sized cuff. It should fit smoothly and snugly around your bare upper arm. There should be enough room to slip a fingertip under the cuff. The bottom edge of the cuff should be 1 inch above the crease of the elbow. ?Ideally, take 3 measurements at one sitting and record the average. ? ? ?

## 2021-11-26 NOTE — Assessment & Plan Note (Signed)
Patient with essential hypertension, doing much better with diastolic readings since adding on olmesartan 5 mg daily.  Renal function stable.  Unfortunately he is also noticing some issues with ED.  Will have him decrease hydralazine to 25 mg and increase olmesartan to 10 mg, to see if the increased dose of hydralazine was the cause.  It could be any of the three, but would like to keep the ARB if at all possible.  He is to call in 2 weeks to let us know if any change in ED.  If not, would consider decreasing carvedilol to 6.25 mg.  If he continues with olmesartan will need to check renal function in 2 weeks.   ?

## 2021-11-26 NOTE — Progress Notes (Signed)
? ? ? ?11/26/2021 ?Fernando Castro ?08-22-1966 ?[id number] ? ? ?HPI:  Fernando Castro is a 56 y.o. male patient of Dr Duke Salvia, who presents today for hypertension clinic evaluation.  In addition to hypertension, his medical history is significant for hyperlipidemia.  He was seen by Dr. Duke Salvia last month after being referred from Dr. Manus Gunning.  His pressure that day was 160/98 and it was noted he was taking metoprolol and hydralazine.  He reported that his pressure started to increase gradually about 18 months ago, shortly after having Covid and dealing with his father having a stroke.  Chart also notes he has gained about 20 pounds since then.  Chart lists multiple drug allergies (see below), most of which are noted to cause a bump in serum creatinine.  Dr. Duke Salvia switched metoprolol to carvedilol and increased hydralazine to twice daily.  Patient had noted BP readings varying between left and right arms, so carotid dopplers were performed.  No indication of blockages in subclavian, vertebral or carotid arteries. At his last visit we added olmesartan 5 mg daily and his kidney function remained stable at 1.2 ( ? ?At his last visit we started olmesartan 5 mg daily.  Scr was 1.2 after 2 weeks (hx 1.16-1.33).  He returns today for follow up.   States feeling well overall, but has noticed that he is having issues with ED - started at some point in the past month and not sure which medication is could be related to.   ? ?Blood Pressure Goal:  130/80 ? ?Current Medications:  carvedilol 12.5 mg bid, hydralazine 50 mg bid, olmesartan 5 mg qd ? ?Family Hx: mother now 69 with controlled hypertension otherwise healthy; father had stroke 2 years ago, in assisted living, no problems before this ? ?Social Hx: no tobacco, occasional alcohol, coffee home brewed, no soda since seeing TR ? ?Diet: home cooked, no added salt, occasional veggie, plenty of fruits, balance of meats; no snacking ; diet since seeing TR.  Weight  down ? ?Exercise: walking some in neighborhood, and gym for workout on treadmill ? ?Intolerances:  amlodipine - edema; hctz, lisinopril, valsartan all noted to cause increase in SCr ? ?Home BP readings:  home cuff just 69 weeks old ? AM 25 readings average  131/86  previous average 139/91  ?PM 21 readings average  131/82  previous average 142/91 ? ?Wt Readings from Last 3 Encounters:  ?11/26/21 234 lb (106.1 kg)  ?10/18/21 237 lb (107.5 kg)  ?09/20/21 250 lb 6.4 oz (113.6 kg)  ? ?BP Readings from Last 3 Encounters:  ?11/26/21 (!) 158/95  ?10/18/21 (!) 160/94  ?09/20/21 (!) 160/98  ? ?Pulse Readings from Last 3 Encounters:  ?11/26/21 60  ?10/18/21 (!) 55  ?09/20/21 (!) 57  ? ? ?Current Outpatient Medications  ?Medication Sig Dispense Refill  ? atorvastatin (LIPITOR) 10 MG tablet Take 10 mg by mouth daily.    ? carvedilol (COREG) 12.5 MG tablet Take 1 tablet (12.5 mg total) by mouth 2 (two) times daily. 180 tablet 3  ? hydrALAZINE (APRESOLINE) 50 MG tablet Take 1 tablet (50 mg total) by mouth 2 (two) times daily. 180 tablet 3  ? loratadine (CLARITIN) 10 MG tablet Take 1 tablet by mouth daily.    ? Multiple Vitamins-Minerals (MULTI FOR HIM) TABS Take 1 tablet by mouth daily.    ? olmesartan (BENICAR) 5 MG tablet Take 1 tablet (5 mg total) by mouth daily. 30 tablet 6  ? testosterone (ANDROGEL) 50 MG/5GM (1%) GEL  1 packet to skin in the morning to shoulder, upper arms or abdomen    ? ?No current facility-administered medications for this visit.  ? ? ?Allergies  ?Allergen Reactions  ? Amlodipine   ?  edema ?Other reaction(s): edema (04/2021)  ? Doxazosin   ?  Other reaction(s): ineffective for BP (06/2021)  ? Hydrochlorothiazide   ?  Increase in creatinine ?Other reaction(s): rise in creatinine  ? Lisinopril   ?  Increase in creatinine ?Other reaction(s): rise in creatanine  ? Pravastatin   ?  Other reaction(s): ED (12/2009)  ? Simvastatin   ?  Other reaction(s): elevated LFTs (09/2012)  ? Valsartan   ?  Increase in  creatinine ?Other reaction(s): rising creatinine  ? ? ?Past Medical History:  ?Diagnosis Date  ? Hyperlipidemia   ? Hypertension   ? Obesity 09/20/2021  ? RLS (restless legs syndrome) 09/20/2021  ? ? ?Blood pressure (!) 158/95, pulse 60, resp. rate 17, height 6\' 1"  (1.854 m), weight 234 lb (106.1 kg), SpO2 98 %. ? ?Essential hypertension ?Patient with essential hypertension, doing much better with diastolic readings since adding on olmesartan 5 mg daily.  Renal function stable.  Unfortunately he is also noticing some issues with ED.  Will have him decrease hydralazine to 25 mg and increase olmesartan to 10 mg, to see if the increased dose of hydralazine was the cause.  It could be any of the three, but would like to keep the ARB if at all possible.  He is to call in 2 weeks to let know if any change in ED.  If not, would consider decreasing carvedilol to 6.25 mg.  If he continues with olmesartan will need to check renal function in 2 weeks.   ? ? ?[place] PharmD CPP [facility] ?[facility] ?[address] ?[place], [place] ?[phone] ? ? ? ?

## 2021-12-11 LAB — BASIC METABOLIC PANEL
BUN/Creatinine Ratio: 17 (ref 9–20)
BUN: 20 mg/dL (ref 6–24)
CO2: 29 mmol/L (ref 20–29)
Calcium: 9.8 mg/dL (ref 8.7–10.2)
Chloride: 106 mmol/L (ref 96–106)
Creatinine, Ser: 1.15 mg/dL (ref 0.76–1.27)
Glucose: 101 mg/dL — ABNORMAL HIGH (ref 70–99)
Potassium: 5.1 mmol/L (ref 3.5–5.2)
Sodium: 146 mmol/L — ABNORMAL HIGH (ref 134–144)
eGFR: 74 mL/min/{1.73_m2} (ref >59)

## 2022-01-07 ENCOUNTER — Ambulatory Visit: Payer: BLUE CROSS/BLUE SHIELD | Admitting: Pharmacist

## 2022-01-07 VITALS — BP 152/86 | HR 52 | Resp 17 | Ht 73.0 in | Wt 227.0 lb

## 2022-01-07 DIAGNOSIS — E291 Testicular hypofunction: Secondary | ICD-10-CM | POA: Insufficient documentation

## 2022-01-07 DIAGNOSIS — I129 Hypertensive chronic kidney disease with stage 1 through stage 4 chronic kidney disease, or unspecified chronic kidney disease: Secondary | ICD-10-CM | POA: Insufficient documentation

## 2022-01-07 DIAGNOSIS — J309 Allergic rhinitis, unspecified: Secondary | ICD-10-CM | POA: Insufficient documentation

## 2022-01-07 DIAGNOSIS — K219 Gastro-esophageal reflux disease without esophagitis: Secondary | ICD-10-CM | POA: Insufficient documentation

## 2022-01-07 DIAGNOSIS — I10 Essential (primary) hypertension: Secondary | ICD-10-CM | POA: Diagnosis not present

## 2022-01-07 DIAGNOSIS — G4761 Periodic limb movement disorder: Secondary | ICD-10-CM | POA: Insufficient documentation

## 2022-01-07 MED ORDER — OLMESARTAN MEDOXOMIL 20 MG PO TABS: 20.0000 mg | ORAL_TABLET | Freq: Every day | ORAL | 1 refills | Status: DC

## 2022-01-07 MED ORDER — HYDRALAZINE HCL 25 MG PO TABS: 25.0000 mg | ORAL_TABLET | Freq: Two times a day (BID) | ORAL | 2 refills | Status: DC

## 2022-01-07 NOTE — Patient Instructions (Addendum)
It was nice meeting you today ? ?We would like your blood pressure to be less than 130/80 ? ?Continue your carvedilol 12.5mg  twice a day and hydralazine 25mg  twice a day ? ?Continue to watch your salt intake and continue to exercise ? ?We will increase your olmesartan to 20mg  once a day.  Continue to check your blood pressure at home.  If you feel like it is dropping too quickly, or you feel dizzy or lightheaded, let us know.  We can consider discontinuing the hydralazine at that time ? ?Please call with any questions ? ?Karren Cobble, PharmD, BCACP, [place], CPP ?[address], [address] ?[place], [place], [address] ?Phone: [phone], Fax: [phone]  ? ? ?

## 2022-01-07 NOTE — Progress Notes (Signed)
Patient ID: ORLONDO AKHTER                 DOB: August 17, 1966                      MRN: [id number] ? ? ? ? ?HPI: ?Fernando Castro is a 56 y.o. male referred by Dr. Oval Linsey to HTN clinic. PMH is significant for HTN and HLD. This is patient's third PharmD visit.  At last visit patient's olmesartan was increased and hydralazine was decreased. ? ?Patient presents today in good spirits.  Hydralazine was decreased due to possible ED which has resolved with dose change.  ? ?Patient has had no adverse effects since increasing olmesartan. Goes to the gym on weekends and tries to get exercise at least one other day of the week.  Blood pressure decreases on days he goes to the gym.  Does strength training and 45 minutes on the treadmill ? ?Recent readings: ? ?4/20: 125/84, 114/76 (after walking) ?4/21: 130/81, 118/74 ?4/22: 129/85, 119/78 (after gym) ?4/23: 126/81 (after gym) ?4/24: 135/87 ? ?Current HTN meds:  ?Olmesartan 10mg  daily ?Carvedilol 12.5mg  BID ?Hydralazine 25mg  daily ? ?BP goal: <130/80 ? ? ?Wt Readings from Last 3 Encounters:  ?11/26/21 234 lb (106.1 kg)  ?10/18/21 237 lb (107.5 kg)  ?09/20/21 250 lb 6.4 oz (113.6 kg)  ? ?BP Readings from Last 3 Encounters:  ?11/26/21 (!) 158/95  ?10/18/21 (!) 160/94  ?09/20/21 (!) 160/98  ? ?Pulse Readings from Last 3 Encounters:  ?11/26/21 60  ?10/18/21 (!) 55  ?09/20/21 (!) 57  ? ? ?Renal function: ?CrCl cannot be calculated (Patient's most recent lab result is older than the maximum 21 days allowed.). ? ?Past Medical History:  ?Diagnosis Date  ? Hyperlipidemia   ? Hypertension   ? Obesity 09/20/2021  ? RLS (restless legs syndrome) 09/20/2021  ? ? ?Current Outpatient Medications on File Prior to Visit  ?Medication Sig Dispense Refill  ? atorvastatin (LIPITOR) 10 MG tablet Take 10 mg by mouth daily.    ? carvedilol (COREG) 12.5 MG tablet Take 1 tablet (12.5 mg total) by mouth 2 (two) times daily. 180 tablet 3  ? hydrALAZINE (APRESOLINE) 50 MG tablet Take 1 tablet (50 mg total) by  mouth 2 (two) times daily. 180 tablet 3  ? loratadine (CLARITIN) 10 MG tablet Take 1 tablet by mouth daily.    ? Multiple Vitamins-Minerals (MULTI FOR HIM) TABS Take 1 tablet by mouth daily.    ? olmesartan (BENICAR) 5 MG tablet Take 2 tablets (10 mg total) by mouth daily. 60 tablet 6  ? testosterone (ANDROGEL) 50 MG/5GM (1%) GEL 1 packet to skin in the morning to shoulder, upper arms or abdomen    ? ?No current facility-administered medications on file prior to visit.  ? ? ?Allergies  ?Allergen Reactions  ? Amlodipine   ?  edema ?Other reaction(s): edema (04/2021)  ? Doxazosin   ?  Other reaction(s): ineffective for BP (06/2021)  ? Hydrochlorothiazide   ?  Increase in creatinine ?Other reaction(s): rise in creatinine  ? Lisinopril   ?  Increase in creatinine ?Other reaction(s): rise in creatanine  ? Pravastatin   ?  Other reaction(s): ED (12/2009)  ? Simvastatin   ?  Other reaction(s): elevated LFTs (09/2012)  ? Valsartan   ?  Increase in creatinine ?Other reaction(s): rising creatinine  ? ? ? ?Assessment/Plan: ? ?1. Hypertension -  Patient BP in room 152/86 which is above goal of <130/80. Patient's  BP typically elevated in office, home readings much better controlled.  Home readings right around goal, and at goal when he exercises.  Recommended he continue to exercise, at least 150 minutes a week.   ? ?Since BP still slightly elevated at home will increase olmesartan to 20mg  daily.  Advised that if BP drops or if he feels symptomatic to let us know and will d/c hydralazine.  Patient voiced understanding. ? ?Continue: ?Carvedilol 12.5mg  BID ?Hydralazine 25mg  daily ?Increase olmesartan to 20mg  daily ?Follow up with Dr Oval Linsey in 1 month ? ?Karren Cobble, PharmD, BCACP, [place], CPP ?[address], [address] ?[place], [place], [address] ?Phone: [phone], Fax: [phone]  ? ?

## 2022-04-16 ENCOUNTER — Ambulatory Visit (HOSPITAL_BASED_OUTPATIENT_CLINIC_OR_DEPARTMENT_OTHER): Payer: BLUE CROSS/BLUE SHIELD | Admitting: Cardiovascular Disease

## 2022-04-23 ENCOUNTER — Ambulatory Visit (HOSPITAL_BASED_OUTPATIENT_CLINIC_OR_DEPARTMENT_OTHER): Payer: BLUE CROSS/BLUE SHIELD | Admitting: Cardiovascular Disease

## 2022-04-23 ENCOUNTER — Encounter (HOSPITAL_BASED_OUTPATIENT_CLINIC_OR_DEPARTMENT_OTHER): Payer: Self-pay | Admitting: Cardiovascular Disease

## 2022-04-23 DIAGNOSIS — I10 Essential (primary) hypertension: Secondary | ICD-10-CM | POA: Diagnosis not present

## 2022-04-23 DIAGNOSIS — E7800 Pure hypercholesterolemia, unspecified: Secondary | ICD-10-CM

## 2022-04-23 MED ORDER — SPIRONOLACTONE 25 MG PO TABS: 25.0000 mg | ORAL_TABLET | Freq: Every day | ORAL | 2 refills | Status: DC

## 2022-04-23 NOTE — Assessment & Plan Note (Signed)
Blood pressure is better but still above goal.  We will have him stop hydralazine and start spironolactone 25mg .  He is already scheduled to get labs with his his PCP next week.  Continue on losartan and carvedilol.  Encouraged him to keep up the exercise.

## 2022-04-23 NOTE — Progress Notes (Signed)
[facility] Follow-up:    Date:  04/23/2022   ID:  Fernando Castro, DOB 1966/05/29, MRN [id number]  PCP:  Blair Heys, MD  Cardiologist:  None  Nephrologist:  Referring MD: Blair Heys, MD   CC: Hypertension  History of Present Illness:    Fernando Castro is a 56 y.o. male with a hx of hypertension, hyperlipidemia, and restless legs, here for follow up. He was initially seen in the [facility] on 09/20/2021. He saw Dr. Manus Gunning on 06/2021 and his blood pressure was initially 142/78, and increased to 170/100 on repeat. At that time he was taking metoprolol and hydralazine. He has allergies to lisinopril, valsartan, amlodipine, doxazosin, HCTZ, pravastatin, and simvastatin. He had renal dopplers that were negative for renal artery stenosis 01/2021. He was referred to [facility] for further management.  Previously his blood pressure had been controlled on medication for 10+ years in the 130s/80s. At the last appointment his blood pressures were averaging 150s/90s.  We encouraged him to increase his exercise. Metoprolol was switched to carvedilol. Hydralazine was increased. He followed up with our pharmacist 10/2021. His blood pressure remained high, so he was started on olmesartan. At follow up he reported ED, hydralazine was decreased and olmesartan was increased. It remained elevated 12/2021 so the dose was increased to 20 mg. He had carotid dopplers 10/2021 that were normal bilaterally.   Today, he states he is feeling good. His main concern today is his blood pressure. He presents a BP log which is personally reviewed. There have been fewer readings of 140's-150's in the evenings. He has been tolerating the higher dose of olmesartan. For exercise he went to the gym for 2 days last weekend. While at the gym he usually uses the treadmill for 45 minutes and lifts weights. He denies any anginal symptoms. Regarding his diet he typically has a  combination of ordering out and cooking meals at home. He does not always monitor his salt intake, but he never adds extra salt to his meals. He denies any palpitations, chest pain, shortness of breath, or peripheral edema. No lightheadedness, headaches, syncope, orthopnea, or PND.    Previous antihypertensives: Lisinopril Valsartan Amlodipine Doxazosin HCTZ  Past Medical History:  Diagnosis Date   Hyperlipidemia    Hypertension    Obesity 09/20/2021   RLS (restless legs syndrome) 09/20/2021    Past Surgical History:  Procedure Laterality Date   TESTICLE TORSION REDUCTION      Current Medications: Current Meds  Medication Sig   atorvastatin (LIPITOR) 10 MG tablet Take 10 mg by mouth daily.   carvedilol (COREG) 12.5 MG tablet Take 1 tablet (12.5 mg total) by mouth 2 (two) times daily.   loratadine (CLARITIN) 10 MG tablet Take 1 tablet by mouth daily.   Multiple Vitamins-Minerals (MULTI FOR HIM) TABS Take 1 tablet by mouth daily.   olmesartan (BENICAR) 20 MG tablet Take 1 tablet (20 mg total) by mouth daily.   spironolactone (ALDACTONE) 25 MG tablet Take 1 tablet (25 mg total) by mouth daily.   testosterone (ANDROGEL) 50 MG/5GM (1%) GEL 1 packet to skin in the morning to shoulder, upper arms or abdomen   [DISCONTINUED] hydrALAZINE (APRESOLINE) 25 MG tablet Take 1 tablet (25 mg total) by mouth 2 (two) times daily.     Allergies:   Amlodipine, Doxazosin, Hydrochlorothiazide, Lisinopril, Pravastatin, Simvastatin, and Valsartan   Social History   Socioeconomic History   Marital status: Married    Spouse name: Not on  file   Number of children: Not on file   Years of education: Not on file   Highest education level: Not on file  Occupational History   Not on file  Tobacco Use   Smoking status: Never   Smokeless tobacco: Never  Substance and Sexual Activity   Alcohol use: Yes    Comment: Occasionally   Drug use: Never   Sexual activity: Not on file  Other Topics Concern    Not on file  Social History Narrative   Not on file   Social Determinants of Health   Financial Resource Strain: Low Risk  (09/20/2021)   Overall Financial Resource Strain (CARDIA)    Difficulty of Paying Living Expenses: Not hard at all  Food Insecurity: No Food Insecurity (09/20/2021)   Hunger Vital Sign    Worried About Running Out of Food in the Last Year: Never true    Ran Out of Food in the Last Year: Never true  Transportation Needs: No Transportation Needs (09/20/2021)   PRAPARE - Administrator, Civil Service (Medical): No    Lack of Transportation (Non-Medical): No  Physical Activity: Inactive (09/20/2021)   Exercise Vital Sign    Days of Exercise per Week: 0 days    Minutes of Exercise per Session: 0 min  Stress: Not on file  Social Connections: Not on file     Family History: The patient's family history includes Heart disease in his paternal grandfather; Hyperlipidemia in his brother and mother; Hypertension in his brother and mother; Stroke in his father.  ROS:   Please see the history of present illness.    All other systems reviewed and are negative.  EKGs/Labs/Other Studies Reviewed:    Bilateral Carotid Duplex Ultrasound 10/18/2021: Summary:  Right Carotid: There is no evidence of stenosis in the right ICA. There  was no evidence of thrombus, dissection, atherosclerotic plaque or stenosis in the cervical carotid system.   Left Carotid: There is no evidence of stenosis in the left ICA. The  extracranial vessels were near-normal with only minimal wall thickening  or plaque.   Vertebrals:  Bilateral vertebral arteries demonstrate antegrade flow.  Subclavians: Normal flow hemodynamics were seen in bilateral subclavian               arteries.    Renal Artery Duplex 01/18/2021: COMPARISON:  Abdominal ultrasound-09/26/2010   FINDINGS: Right Kidney:   Normal cortical thickness, echogenicity and size, measuring 12.4 cm in length. There is an approximately  2.1 x 1.8 x 2.0 cm hypoechoic partially exophytic lesion arising from the superior pole of the right kidney (images 45 - 49), not definitely seen on remote abdominal ultrasound performed in 2012 and cannot be characterized as a simple renal cyst. Additionally, there is an apparent 1.4 cm echogenic nodule within the interpolar aspect of the right kidney (images 50 - 52) which may represent interposed parapelvic fat though conceivably an angiomyolipoma could have a similar appearance. Note is made of an approximately 2.2 cm anechoic lesion involving the inferior pole of the right kidney which is favored to represent a renal cyst (images 71 through 73). No echogenic renal stones. No urinary obstruction.   Left Kidney:   Normal cortical thickness, echogenicity and size, measuring 13.3 cm in length. Note is made of an approximately 1.8 x 1.8 x 1.2 cm partially exophytic anechoic cyst involving the inferior pole the left kidney (images 112 through 113). No echogenic renal stones. No urinary obstruction.   Bladder: There is  apparent mass effect of the prostate gland upon the undersurface of the urinary bladder (images 114 and 119).   _________________________________________________________   Renal Duplex Ultrasound 01/18/2021: IMPRESSION: 1. No evidence of renal artery stenosis, asymmetric renal atrophy or urinary obstruction. 2. Indeterminate right-sided renal lesions, not definitely seen on remote abdominal ultrasound performed 09/2010. Correlation with more recent prior outside examinations (if available), is advised. Otherwise, further evaluation could be performed with dedicated renal MRI as indicated. 3. Potential mass effect of the prostate gland upon the undersurface of the urinary bladder. If not recently performed, DRE is advised. 4. Suspected hepatic steatosis.  Correlation with LFTs is advised.  EKG:  EKG is personally reviewed.  04/23/22: EKG was not ordered. 09/20/2021:  Sinus bradycardia. Rate 57 bpm.  Recent Labs: 10/18/2021: TSH 0.619 12/11/2021: BUN 20; Creatinine, Ser 1.15; Potassium 5.1; Sodium 146   Recent Lipid Panel No results found for: "CHOL", "TRIG", "HDL", "CHOLHDL", "VLDL", "LDLCALC", "LDLDIRECT"  Physical Exam:    VS:  BP (!) 148/88 (BP Location: Right Arm, Patient Position: Sitting, Cuff Size: Large)   Pulse (!) 50   Ht 6\' 1"  (1.854 m)   Wt 233 lb 9.6 oz (106 kg)   SpO2 96%   BMI 30.82 kg/m  , BMI Body mass index is 30.82 kg/m. GENERAL:  Well appearing HEENT: Pupils equal round and reactive, fundi not visualized, oral mucosa unremarkable NECK:  No jugular venous distention, waveform within normal limits, carotid upstroke brisk and symmetric, no bruits, no thyromegaly LUNGS:  Clear to auscultation bilaterally HEART:  RRR.  PMI not displaced or sustained,S1 and S2 within normal limits, no S3, no S4, no clicks, no rubs, no murmurs ABD:  Flat, positive bowel sounds normal in frequency in pitch, no bruits, no rebound, no guarding, no midline pulsatile mass, no hepatomegaly, no splenomegaly EXT:  1 plus pulses throughout, no edema, no cyanosis no clubbing SKIN:  No rashes no nodules NEURO:  Cranial nerves II through XII grossly intact, motor grossly intact throughout PSYCH:  Cognitively intact, oriented to person place and time   ASSESSMENT/PLAN:    Essential hypertension Blood pressure is better but still above goal.  We will have him stop hydralazine and start spironolactone 25mg .  He is already scheduled to get labs with his his PCP next week.  Continue on losartan and carvedilol.  Encouraged him to keep up the exercise.  Hypercholesterolemia Continue atorvastatin.  Lipids are well-controlled.  Keep up the exercise.    Screening for Secondary Hypertension:     09/20/2021    3:58 PM  Causes  Drugs/Herbals Screened     - Comments Will work on limiting sodium intake.  2 caffeinated drinks daily. Androgel  Renovascular HTN Screened   Sleep Apnea Screened     - Comments no symptoms  Thyroid Disease Screened     - Comments check TSH  Hyperaldosteronism N/A  Pheochromocytoma N/A  Cushing's Syndrome N/A  Hyperparathyroidism N/A  Coarctation of the Aorta Screened     - Comments BP asymetric.  Check carotid Dopplers  Compliance Screened    Relevant Labs/Studies:    Latest Ref Rng & Units 12/11/2021    8:26 AM  Basic Labs  Sodium 134 - 144 mmol/L 146   Potassium 3.5 - 5.2 mmol/L 5.1   Creatinine 0.76 - 1.27 mg/dL 11/18/2021      Disposition:    FU with Ima Hafner C. 12/13/2021, MD, [facility] in 1 months with virtual visit.   Medication Adjustments/Labs and Tests Ordered: Current medicines  are reviewed at length with the patient today.  Concerns regarding medicines are outlined above.   No orders of the defined types were placed in this encounter.  Meds ordered this encounter  Medications   spironolactone (ALDACTONE) 25 MG tablet    Sig: Take 1 tablet (25 mg total) by mouth daily.    Dispense:  90 tablet    Refill:  2    I,Breanna Adamick,acting as a scribe for Chilton Si, MD.,have documented all relevant documentation on the behalf of Chilton Si, MD,as directed by  Chilton Si, MD while in the presence of Chilton Si, MD.   I, Haseeb Fiallos C. Duke Salvia, MD have reviewed all documentation for this visit.  The documentation of the exam, diagnosis, procedures, and orders on 04/23/2022 are all accurate and complete.   Signed, Chilton Si, MD  04/23/2022 12:58 PM    [facility]

## 2022-04-23 NOTE — Patient Instructions (Signed)
Medication Instructions:  STOP- Hydralazine START- Spironolactone 25 mg by mouth daily  *If you need a refill on your cardiac medications before your next appointment, please call your pharmacy*   Lab Work: None Ordered   Testing/Procedures: None Ordered   Follow-Up: At [facility], you and your health needs are our priority.  As part of our continuing mission to provide you with exceptional heart care, we have created designated Provider Care Teams.  These Care Teams include your primary Cardiologist (physician) and Advanced Practice Providers (APPs -  Physician Assistants and Nurse Practitioners) who all work together to provide you with the care you need, when you need it.  We recommend signing up for the patient portal called "MyChart".  Sign up information is provided on this After Visit Summary.  MyChart is used to connect with patients for Virtual Visits (Telemedicine).  Patients are able to view lab/test results, encounter notes, upcoming appointments, etc.  Non-urgent messages can be sent to your provider as well.   To learn more about what you can do with MyChart, go to [email or web address].    Your next appointment:   1 month(s)  The format for your next appointment:   Virtual Visit   Provider:   Chilton Si, MD    Other Instructions   Important Information About Sugar

## 2022-04-23 NOTE — Assessment & Plan Note (Signed)
Continue atorvastatin.  Lipids are well-controlled.  Keep up the exercise.

## 2022-05-21 NOTE — Progress Notes (Incomplete)
Virtual Visit via Video Note   Because of Fernando Castro's co-morbid illnesses, he is at least at moderate risk for complications without adequate follow up.  This format is felt to be most appropriate for this patient at this time.  All issues noted in this document were discussed and addressed.  A limited physical exam was performed with this format.  Please refer to the patient's chart for his consent to telehealth for [facility].  The patient was identified using 2 identifiers.  Date:  05/22/2022   ID:  Fernando Castro, DOB May 27, 1966, MRN [id number]  Patient Location: Home Provider Location: Office/Clinic  PCP:  Blair Heys, MD  Cardiologist:  Chilton Si, MD  Electrophysiologist:  None   Evaluation Performed:  Follow-Up Visit  Chief Complaint:  Hypertension   History of Present Illness:    The patient does not have symptoms concerning for COVID-19 infection (fever, chills, cough, or new shortness of breath).   Fernando Castro is a 56 y.o. male with a hx of hypertension, hyperlipidemia, and restless legs, here for follow up. He was initially seen in the Advanced Hypertension Clinic on 09/20/2021. He saw Dr. Manus Gunning on 06/2021 and his blood pressure was initially 142/78, and increased to 170/100 on repeat. At that time he was taking metoprolol and hydralazine. He has allergies to lisinopril, valsartan, amlodipine, doxazosin, HCTZ, pravastatin, and simvastatin. He had renal dopplers that were negative for renal artery stenosis 01/2021. He was referred to Advanced Hypertension Clinic for further management.  Previously his blood pressure had been controlled on medication for 10+ years in the 130s/80s. At the last appointment his blood pressures were averaging 150s/90s.  We encouraged him to increase his exercise. Metoprolol was switched to carvedilol. Hydralazine was increased. He followed up with our pharmacist 10/2021. His blood pressure remained high, so he was  started on olmesartan. At follow up he reported ED, hydralazine was decreased and olmesartan was increased. It remained elevated 12/2021 so the dose was increased to 20 mg. He had carotid dopplers 10/2021 that were normal bilaterally.   At his last visit blood pressures were uncontrolled despite exercising regularly. Hydralazine was switched to spironolactone. Today, he reports feeling well. At home his blood pressure still fluctuates but overall seems to be controlled in the 130s/70-80s on average. He tries to exercise but has not been able to do so frequently. He is usually limited by his work schedule. He denies any palpitations, chest pain, shortness of breath, or peripheral edema. No lightheadedness, headaches, syncope, orthopnea, or PND.   Previous antihypertensives: Lisinopril Valsartan Amlodipine Doxazosin HCTZ Hydralazine  Past Medical History:  Diagnosis Date   Hyperlipidemia    Hypertension    Obesity 09/20/2021   RLS (restless legs syndrome) 09/20/2021    Past Surgical History:  Procedure Laterality Date   TESTICLE TORSION REDUCTION      Current Medications: Current Meds  Medication Sig   atorvastatin (LIPITOR) 10 MG tablet Take 10 mg by mouth daily.   carvedilol (COREG) 12.5 MG tablet Take 1 tablet (12.5 mg total) by mouth 2 (two) times daily.   loratadine (CLARITIN) 10 MG tablet Take 1 tablet by mouth daily.   Multiple Vitamins-Minerals (MULTI FOR HIM) TABS Take 1 tablet by mouth daily.   olmesartan (BENICAR) 20 MG tablet Take 1 tablet (20 mg total) by mouth daily.   spironolactone (ALDACTONE) 25 MG tablet Take 1 tablet (25 mg total) by mouth daily.   testosterone (ANDROGEL) 50 MG/5GM (1%) GEL 1  packet to skin in the morning to shoulder, upper arms or abdomen     Allergies:   Amlodipine, Doxazosin, Hydrochlorothiazide, Lisinopril, Pravastatin, Simvastatin, and Valsartan   Social History   Socioeconomic History   Marital status: Married    Spouse name: Not on file    Number of children: Not on file   Years of education: Not on file   Highest education level: Not on file  Occupational History   Not on file  Tobacco Use   Smoking status: Never   Smokeless tobacco: Never  Substance and Sexual Activity   Alcohol use: Yes    Comment: Occasionally   Drug use: Never   Sexual activity: Not on file  Other Topics Concern   Not on file  Social History Narrative   Not on file   Social Determinants of Health   Financial Resource Strain: Low Risk  (09/20/2021)   Overall Financial Resource Strain (CARDIA)    Difficulty of Paying Living Expenses: Not hard at all  Food Insecurity: No Food Insecurity (09/20/2021)   Hunger Vital Sign    Worried About Running Out of Food in the Last Year: Never true    Ran Out of Food in the Last Year: Never true  Transportation Needs: No Transportation Needs (09/20/2021)   PRAPARE - Administrator, Civil Service (Medical): No    Lack of Transportation (Non-Medical): No  Physical Activity: Inactive (09/20/2021)   Exercise Vital Sign    Days of Exercise per Week: 0 days    Minutes of Exercise per Session: 0 min  Stress: Not on file  Social Connections: Not on file     Family History: The patient's family history includes Heart disease in his paternal grandfather; Hyperlipidemia in his brother and mother; Hypertension in his brother and mother; Stroke in his father.  ROS:   Please see the history of present illness.    All other systems reviewed and are negative.  EKGs/Labs/Other Studies Reviewed:    Bilateral Carotid Duplex Ultrasound 10/18/2021: Summary:  Right Carotid: There is no evidence of stenosis in the right ICA. There  was no evidence of thrombus, dissection, atherosclerotic plaque or stenosis in the cervical carotid system.   Left Carotid: There is no evidence of stenosis in the left ICA. The  extracranial vessels were near-normal with only minimal wall thickening  or plaque.   Vertebrals:   Bilateral vertebral arteries demonstrate antegrade flow.  Subclavians: Normal flow hemodynamics were seen in bilateral subclavian arteries.    Renal Duplex Ultrasound 01/18/2021: IMPRESSION: 1. No evidence of renal artery stenosis, asymmetric renal atrophy or urinary obstruction. 2. Indeterminate right-sided renal lesions, not definitely seen on remote abdominal ultrasound performed 09/2010. Correlation with more recent prior outside examinations (if available), is advised. Otherwise, further evaluation could be performed with dedicated renal MRI as indicated. 3. Potential mass effect of the prostate gland upon the undersurface of the urinary bladder. If not recently performed, DRE is advised. 4. Suspected hepatic steatosis.  Correlation with LFTs is advised.  EKG:  EKG is personally reviewed.  05/22/2022:  EKG was not ordered. 04/23/22: EKG was not ordered. 09/20/2021: Sinus bradycardia. Rate 57 bpm.  Recent Labs: 10/18/2021: TSH 0.619 12/11/2021: BUN 20; Creatinine, Ser 1.15; Potassium 5.1; Sodium 146   Recent Lipid Panel No results found for: "CHOL", "TRIG", "HDL", "CHOLHDL", "VLDL", "LDLCALC", "LDLDIRECT"  05/03/2022: Total cholesterol 169, triglycerides 139, HDL 38, LDL 107 Sodium 142, potassium 5.5, BUN 17, creatinine 1.7 AST 25, ALT 31  Physical Exam:    BP 128/84   Pulse 68   Ht 6\' 1"  (1.854 m)   Wt 230 lb (104.3 kg)   BMI 30.34 kg/m  GENERAL: Well-appearing.  No acute distress. HEENT: Pupils equal round.  Oral mucosa unremarkable NECK:  No jugular venous distention, no visible thyromegaly EXT:  No edema, no cyanosis no clubbing SKIN:  No rashes no nodules NEURO:  Speech fluent.  Cranial nerves grossly intact.  Moves all 4 extremities freely PSYCH:  Cognitively intact, oriented to person place and time   ASSESSMENT/PLAN:    Essential hypertension Blood pressure has been much better controlled since adding spironolactone.  He seems to be tolerating this well.  Continue  carvedilol, olmesartan, and spironolactone.  We will get a copy of his most recent labs with he reported were normal since starting spironolactone.    Obesity Encouraged to increase exercise to at least 150 minutes weekly.    Screening for Secondary Hypertension:     09/20/2021    3:58 PM  Causes  Drugs/Herbals Screened     - Comments Will work on limiting sodium intake.  2 caffeinated drinks daily. Androgel  Renovascular HTN Screened  Sleep Apnea Screened     - Comments no symptoms  Thyroid Disease Screened     - Comments check TSH  Hyperaldosteronism N/A  Pheochromocytoma N/A  Cushing's Syndrome N/A  Hyperparathyroidism N/A  Coarctation of the Aorta Screened     - Comments BP asymetric.  Check carotid Dopplers  Compliance Screened    Relevant Labs/Studies:    Latest Ref Rng & Units 12/11/2021    8:26 AM  Basic Labs  Sodium 134 - 144 mmol/L 146   Potassium 3.5 - 5.2 mmol/L 5.1   Creatinine 0.76 - 1.27 mg/dL 12/13/2021      5.73 Education: The signs and symptoms of COVID-19 were discussed with the patient and how to seek care for testing (follow up with PCP or arrange E-visit).  The importance of social distancing was discussed today.   Time:   Today, I have spent 17 minutes with the patient with telehealth technology discussing the above problems.    Disposition:    FU with Xcaret Morad C. [id number], MD, [facility] in 6 months.  Medication Adjustments/Labs and Tests Ordered: Current medicines are reviewed at length with the patient today.  Concerns regarding medicines are outlined above.   No orders of the defined types were placed in this encounter.  No orders of the defined types were placed in this encounter.  I,Mathew Stumpf,acting as a [facility] for Neurosurgeon, MD.,have documented all relevant documentation on the behalf of Chilton Si, MD,as directed by  Chilton Si, MD while in the presence of Chilton Si, MD.  I, Syanna Remmert C. Chilton Si, MD have reviewed  all documentation for this visit.  The documentation of the exam, diagnosis, procedures, and orders on 05/22/2022 are all accurate and complete.   Signed, 07/22/2022, MD  05/22/2022 1:26 PM    [facility]

## 2022-05-22 ENCOUNTER — Encounter (HOSPITAL_BASED_OUTPATIENT_CLINIC_OR_DEPARTMENT_OTHER): Payer: Self-pay | Admitting: Cardiovascular Disease

## 2022-05-22 ENCOUNTER — Telehealth (INDEPENDENT_AMBULATORY_CARE_PROVIDER_SITE_OTHER): Payer: BLUE CROSS/BLUE SHIELD | Admitting: Cardiovascular Disease

## 2022-05-22 DIAGNOSIS — E6609 Other obesity due to excess calories: Secondary | ICD-10-CM

## 2022-05-22 DIAGNOSIS — I10 Essential (primary) hypertension: Secondary | ICD-10-CM | POA: Diagnosis not present

## 2022-05-22 NOTE — Assessment & Plan Note (Signed)
Blood pressure has been much better controlled since adding spironolactone.  He seems to be tolerating this well.  Continue carvedilol, olmesartan, and spironolactone.  We will get a copy of his most recent labs with he reported were normal since starting spironolactone.

## 2022-05-22 NOTE — Assessment & Plan Note (Signed)
Encouraged to increase exercise to at least 150 minutes weekly.

## 2022-05-22 NOTE — Patient Instructions (Addendum)
Medication Instructions:  Your physician recommends that you continue on your current medications as directed. Please refer to the Current Medication list given to you today.   *If you need a refill on your cardiac medications before your next appointment, please call your pharmacy*  Lab Work: WILL GET LABS FROM PRIMARY    Testing/Procedures: NONE   Follow-Up: 11-20-2022 9:20 AM WITH DR [facility]   Other Instructions  Exercise recommendations: The American Heart Association recommends 150 minutes of moderate intensity exercise weekly. Try 30 minutes of moderate intensity exercise 4-5 times per week. This could include walking, jogging, or swimming.

## 2022-05-23 ENCOUNTER — Encounter (HOSPITAL_BASED_OUTPATIENT_CLINIC_OR_DEPARTMENT_OTHER): Payer: Self-pay

## 2022-07-10 ENCOUNTER — Other Ambulatory Visit: Payer: Self-pay | Admitting: Cardiovascular Disease

## 2022-07-10 DIAGNOSIS — I10 Essential (primary) hypertension: Secondary | ICD-10-CM

## 2022-07-10 NOTE — Telephone Encounter (Signed)
Rx request sent to pharmacy.  

## 2022-09-10 ENCOUNTER — Other Ambulatory Visit (HOSPITAL_BASED_OUTPATIENT_CLINIC_OR_DEPARTMENT_OTHER): Payer: Self-pay | Admitting: Cardiovascular Disease

## 2022-09-11 NOTE — Telephone Encounter (Signed)
Rx(s) sent to pharmacy electronically.  

## 2022-11-18 NOTE — Progress Notes (Signed)
[facility] Follow-up:   Date:  11/20/2022   ID:  Fernando Castro, DOB [birth year], MRN [id number]  PCP:  Gaynelle Arabian, MD  Cardiologist:  Skeet Latch, MD  Electrophysiologist:  None   Evaluation Performed:  Follow-Up Visit  Chief Complaint:  Hypertension   History of Present Illness:    Fernando Castro is a 57 y.o. male with a hx of hypertension, hyperlipidemia, and restless legs, here for follow up. He was initially seen in the [facility] on 09/20/2021. He saw Dr. Marisue Humble on 06/2021 and his blood pressure was initially 142/78, and increased to 170/100 on repeat. At that time he was taking metoprolol and hydralazine. He has allergies to lisinopril, valsartan, amlodipine, doxazosin, HCTZ, pravastatin, and simvastatin. He had renal dopplers that were negative for renal artery stenosis 01/2021. He was referred to [facility] for further management.  Previously his blood pressure had been controlled on medication for 10+ years in the 130s/80s. Metoprolol was switched to carvedilol and hydralazine was increased. He followed up with our pharmacist 10/2021. His blood pressure remained high, so he was started on olmesartan. At follow up he reported ED, hydralazine was decreased and olmesartan was increased. It remained elevated 12/2021 so the dose was increased to 20 mg. He had carotid dopplers 10/2021 that were normal bilaterally. Blood pressures were uncontrolled despite exercising regularly. Hydralazine was switched to spironolactone.   At his last appointment he reported average home blood pressures in the 130s/70s-80s, improved since adding spironolactone. He was encouraged to work on increasing his exercise. Today, he is feeling pretty good. He presents a BP log which is personally reviewed. His readings are more controlled at home (110s-120s/80s) than in the office (145/87; 146/80 on recheck in clinic). His resting heart rates average in  the 60s. He takes his antihypertensives at night. Typically he denies issues with lightheadedness or dizziness. However, he did feel "a little off" yesterday. He believes this is due to his higher activity level this past weekend as well as focusing on eating smaller portions. Lately, he admits to not being as active as he should. He is limited by a busy work schedule. He works as a Government social research officer and is mostly sedentary unless he goes to a job site. This past weekend he was slightly more active than usual. When he does exercise, he has felt soreness attributable to deconditioning. If he over-exerts he will feel out of breath, but he is not alarmed by this. Occasionally, he has noticed a relatively new burning sensation in his bilateral toes. He denies any palpitations, chest pain, or peripheral edema. No headaches, syncope, orthopnea, or PND.  Previous antihypertensives: Lisinopril Valsartan Amlodipine Doxazosin HCTZ Hydralazine  Past Medical History:  Diagnosis Date   Hyperlipidemia    Hypertension    Obesity 09/20/2021   RLS (restless legs syndrome) 09/20/2021    Past Surgical History:  Procedure Laterality Date   TESTICLE TORSION REDUCTION      Current Medications: Current Meds  Medication Sig   atorvastatin (LIPITOR) 10 MG tablet Take 10 mg by mouth daily.   carvedilol (COREG) 12.5 MG tablet Take 1 tablet (12.5 mg total) by mouth 2 (two) times daily.   loratadine (CLARITIN) 10 MG tablet Take 1 tablet by mouth daily.   Multiple Vitamins-Minerals (MULTI FOR HIM) TABS Take 1 tablet by mouth daily.   olmesartan (BENICAR) 20 MG tablet TAKE ONE TABLET BY MOUTH DAILY   spironolactone (ALDACTONE) 25 MG tablet Take 1 tablet (25  mg total) by mouth daily.   testosterone (ANDROGEL) 50 MG/5GM (1%) GEL 1 packet to skin in the morning to shoulder, upper arms or abdomen     Allergies:   Amlodipine, Doxazosin, Hydrochlorothiazide, Lisinopril, Pravastatin, Simvastatin, and Valsartan   Social  History   Socioeconomic History   Marital status: Married    Spouse name: Not on file   Number of children: Not on file   Years of education: Not on file   Highest education level: Not on file  Occupational History   Not on file  Tobacco Use   Smoking status: Never   Smokeless tobacco: Never  Substance and Sexual Activity   Alcohol use: Yes    Comment: Occasionally   Drug use: Never   Sexual activity: Not on file  Other Topics Concern   Not on file  Social History Narrative   Not on file   Social Determinants of Health   Financial Resource Strain: Low Risk  (09/20/2021)   Overall Financial Resource Strain (CARDIA)    Difficulty of Paying Living Expenses: Not hard at all  Food Insecurity: No Food Insecurity (09/20/2021)   Hunger Vital Sign    Worried About Running Out of Food in the Last Year: Never true    Ran Out of Food in the Last Year: Never true  Transportation Needs: No Transportation Needs (09/20/2021)   PRAPARE - Hydrologist (Medical): No    Lack of Transportation (Non-Medical): No  Physical Activity: Inactive (09/20/2021)   Exercise Vital Sign    Days of Exercise per Week: 0 days    Minutes of Exercise per Session: 0 min  Stress: Not on file  Social Connections: Not on file     Family History: The patient's family history includes Heart disease in his paternal grandfather; Hyperlipidemia in his brother and mother; Hypertension in his brother and mother; Stroke in his father.  ROS:   Please see the history of present illness.    (+) Exertional shortness of breath and myalgias (+) Burning sensation of bilateral toes All other systems reviewed and are negative.  EKGs/Labs/Other Studies Reviewed:    Bilateral Carotid Duplex Ultrasound 10/18/2021: Summary:  Right Carotid: There is no evidence of stenosis in the right ICA. There  was no evidence of thrombus, dissection, atherosclerotic plaque or stenosis in the cervical carotid system.    Left Carotid: There is no evidence of stenosis in the left ICA. The  extracranial vessels were near-normal with only minimal wall thickening  or plaque.   Vertebrals:  Bilateral vertebral arteries demonstrate antegrade flow.  Subclavians: Normal flow hemodynamics were seen in bilateral subclavian arteries.    Renal Duplex Ultrasound 01/18/2021: IMPRESSION: 1. No evidence of renal artery stenosis, asymmetric renal atrophy or urinary obstruction. 2. Indeterminate right-sided renal lesions, not definitely seen on remote abdominal ultrasound performed 09/2010. Correlation with more recent prior outside examinations (if available), is advised. Otherwise, further evaluation could be performed with dedicated renal MRI as indicated. 3. Potential mass effect of the prostate gland upon the undersurface of the urinary bladder. If not recently performed, DRE is advised. 4. Suspected hepatic steatosis.  Correlation with LFTs is advised.  EKG:  EKG is personally reviewed.  11/20/2022:  Sinus bradycardia. Rate 48 bpm. 05/22/2022:  EKG was not ordered. 04/23/22: EKG was not ordered. 09/20/2021: Sinus bradycardia. Rate 57 bpm.  Recent Labs: 12/11/2021: BUN 20; Creatinine, Ser 1.15; Potassium 5.1; Sodium 146   Recent Lipid Panel No results found for: "  CHOL", "TRIG", "HDL", "CHOLHDL", "VLDL", "LDLCALC", "LDLDIRECT"  05/03/2022: Total cholesterol 169, triglycerides 139, HDL 38, LDL 107 Sodium 142, potassium 5.5, BUN 17, creatinine 1.7 AST 25, ALT 31  Physical Exam:    VS:  BP (!) 146/80 (BP Location: Right Arm, Patient Position: Sitting, Cuff Size: Large)   Pulse (!) 48   Ht '6\' 1"'$  (1.854 m)   Wt 240 lb (108.9 kg)   BMI 31.66 kg/m  , BMI Body mass index is 31.66 kg/m. GENERAL:  Well appearing HEENT: Pupils equal round and reactive, fundi not visualized, oral mucosa unremarkable NECK:  No jugular venous distention, waveform within normal limits, carotid upstroke brisk and symmetric, no bruits, no  thyromegaly LUNGS:  Clear to auscultation bilaterally HEART:  RRR.  PMI not displaced or sustained,S1 and S2 within normal limits, no S3, no S4, no clicks, no rubs, no murmurs ABD:  Flat, positive bowel sounds normal in frequency in pitch, no bruits, no rebound, no guarding, no midline pulsatile mass, no hepatomegaly, no splenomegaly EXT:  2 plus pulses throughout, no edema, no cyanosis no clubbing SKIN:  No rashes no nodules NEURO:  Cranial nerves II through XII grossly intact, motor grossly intact throughout PSYCH:  Cognitively intact, oriented to person place and time   ASSESSMENT/PLAN:    Essential hypertension Blood pressure elevated in the office but has been very well-controlled at home.  He is averaging in the 1 teens to 120s.  He was bradycardic in the office, but heart rates are averaging in the 60s to 70s and he has no reports of lightheadedness or dizziness other than the 1 day when he had not been eating well and was more physically active.  No plans to make any changes to his regimen at this time.  I did encourage him to work on increasing his exercise.  The goal is to get at least 150 minutes weekly.  Continue carvedilol, olmesartan, and spironolactone.  Hypercholesterolemia Lipids are well-controlled.  He follows up regularly with his PCP.  Continue atorvastatin and work on increasing exercise as above.   Screening for Secondary Hypertension:     09/20/2021    3:58 PM  Causes  Drugs/Herbals Screened     - Comments Will work on limiting sodium intake.  2 caffeinated drinks daily. Androgel  Renovascular HTN Screened  Sleep Apnea Screened     - Comments no symptoms  Thyroid Disease Screened     - Comments check TSH  Hyperaldosteronism N/A  Pheochromocytoma N/A  Cushing's Syndrome N/A  Hyperparathyroidism N/A  Coarctation of the Aorta Screened     - Comments BP asymetric.  Check carotid Dopplers  Compliance Screened    Relevant Labs/Studies:    Latest Ref Rng &  Units 12/11/2021    8:26 AM  Basic Labs  Sodium 134 - 144 mmol/L 146   Potassium 3.5 - 5.2 mmol/L 5.1   Creatinine 0.76 - 1.27 mg/dL 1.15      Disposition:    FU with Alinda Egolf C. Oval Linsey, MD, [facility] as needed.   Medication Adjustments/Labs and Tests Ordered: Current medicines are reviewed at length with the patient today.  Concerns regarding medicines are outlined above.   Orders Placed This Encounter  Procedures   EKG 12-Lead   No orders of the defined types were placed in this encounter.  I,Fernando Castro,acting as a Education administrator for Skeet Latch, MD.,have documented all relevant documentation on the behalf of Skeet Latch, MD,as directed by  Skeet Latch, MD while in the presence of  Skeet Latch, MD.  I, Fernando Oval Linsey, MD have reviewed all documentation for this visit.  The documentation of the exam, diagnosis, procedures, and orders on 11/20/2022 are all accurate and complete.  Signed, Skeet Latch, MD  11/20/2022 9:41 AM    [place]

## 2022-11-20 ENCOUNTER — Ambulatory Visit (HOSPITAL_BASED_OUTPATIENT_CLINIC_OR_DEPARTMENT_OTHER): Payer: BLUE CROSS/BLUE SHIELD | Admitting: Cardiovascular Disease

## 2022-11-20 ENCOUNTER — Encounter (HOSPITAL_BASED_OUTPATIENT_CLINIC_OR_DEPARTMENT_OTHER): Payer: Self-pay | Admitting: Cardiovascular Disease

## 2022-11-20 VITALS — BP 146/80 | HR 48 | Ht 73.0 in | Wt 240.0 lb

## 2022-11-20 DIAGNOSIS — I10 Essential (primary) hypertension: Secondary | ICD-10-CM

## 2022-11-20 DIAGNOSIS — E7800 Pure hypercholesterolemia, unspecified: Secondary | ICD-10-CM | POA: Diagnosis not present

## 2022-11-20 NOTE — Patient Instructions (Addendum)
Medication Instructions:  Your physician recommends that you continue on your current medications as directed. Please refer to the Current Medication list given to you today.   Labwork: NONE  Testing/Procedures: NONE  Follow-Up: AS NEEDED   Exercise recommendations: The American Heart Association recommends 150 minutes of moderate intensity exercise weekly. Try 30 minutes of moderate intensity exercise 4-5 times per week. This could include walking, jogging, or swimming.

## 2022-11-20 NOTE — Assessment & Plan Note (Signed)
Lipids are well-controlled.  He follows up regularly with his PCP.  Continue atorvastatin and work on increasing exercise as above.

## 2022-11-20 NOTE — Assessment & Plan Note (Signed)
Blood pressure elevated in the office but has been very well-controlled at home.  He is averaging in the 1 teens to 120s.  He was bradycardic in the office, but heart rates are averaging in the 60s to 70s and he has no reports of lightheadedness or dizziness other than the 1 day when he had not been eating well and was more physically active.  No plans to make any changes to his regimen at this time.  I did encourage him to work on increasing his exercise.  The goal is to get at least 150 minutes weekly.  Continue carvedilol, olmesartan, and spironolactone.

## 2023-01-06 ENCOUNTER — Other Ambulatory Visit (HOSPITAL_BASED_OUTPATIENT_CLINIC_OR_DEPARTMENT_OTHER): Payer: Self-pay | Admitting: Cardiovascular Disease

## 2023-01-06 DIAGNOSIS — I10 Essential (primary) hypertension: Secondary | ICD-10-CM

## 2023-01-07 NOTE — Telephone Encounter (Signed)
Rx(s) sent to pharmacy electronically.  

## 2023-02-12 IMAGING — US US RENAL ARTERY STENOSIS
1 series · 12 of 25 positions shown · non-contrast
Comparison: Abdominal ultrasound-09/26/2010

CLINICAL DATA: Hypertension and worsening renal function. Evaluate
for renal artery stenosis.

EXAM:
RENAL/URINARY TRACT ULTRASOUND
RENAL DUPLEX DOPPLER ULTRASOUND

[Series 1: us renal artery stenosis · 0.23mm/px · 12 of 132 slices shown]
[im 6/132]
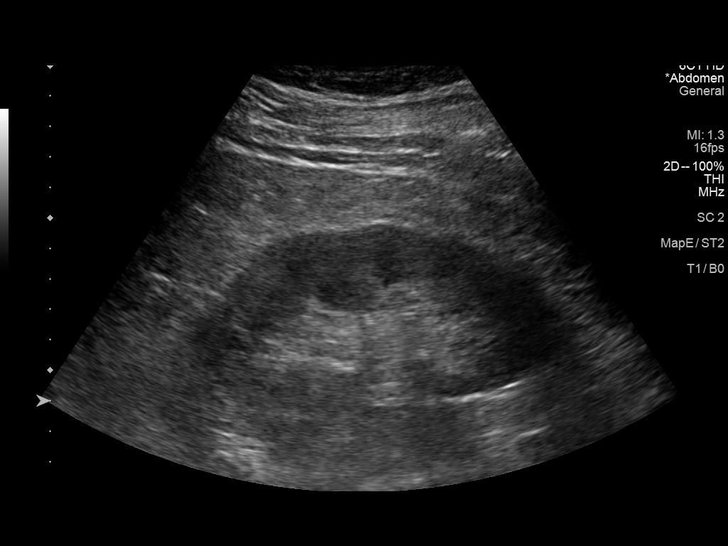
[im 17/132]
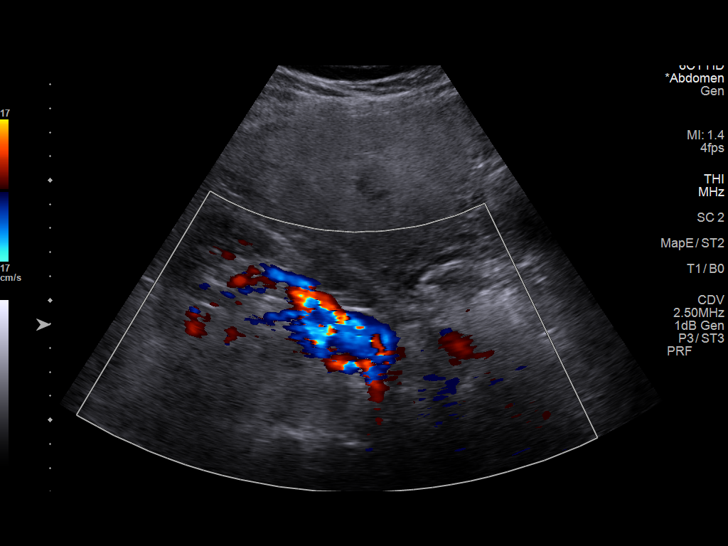
[im 28/132]
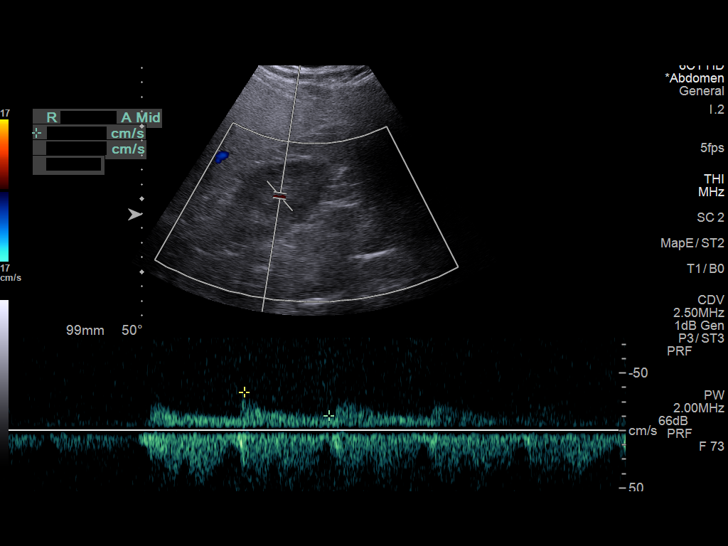
[im 39/132]
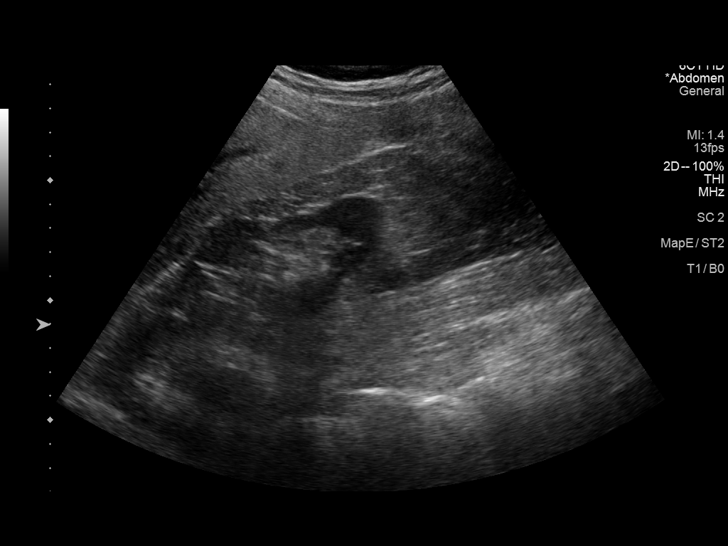
[im 50/132]
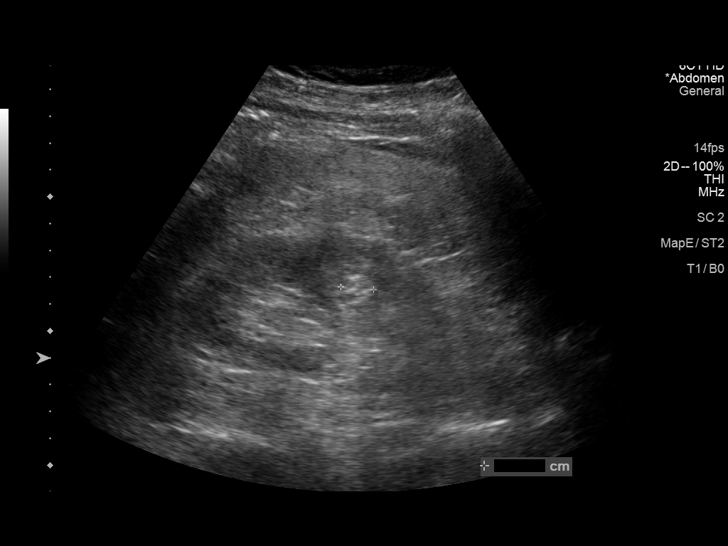
[im 61/132]
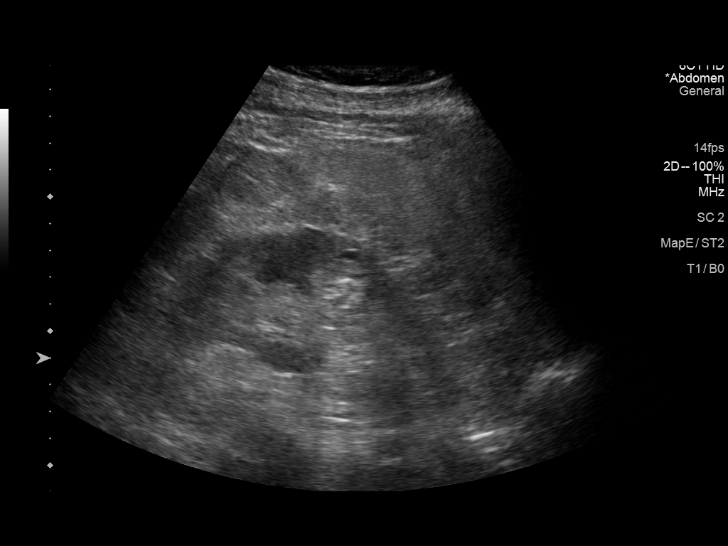
[im 71/132]
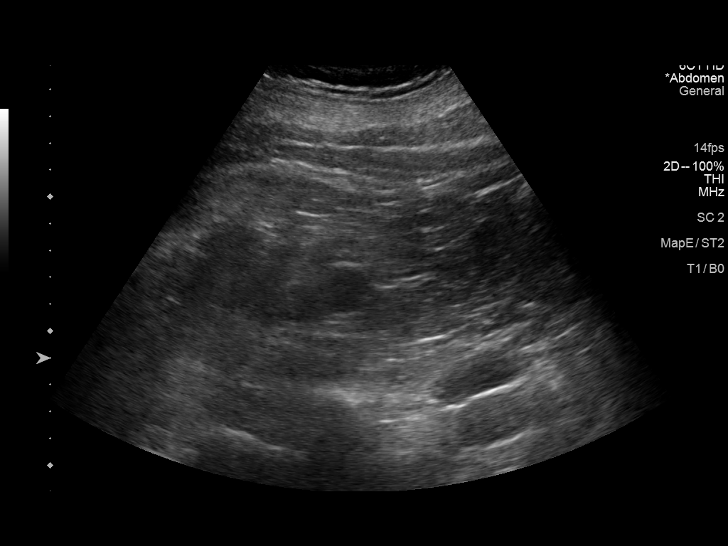
[im 82/132]
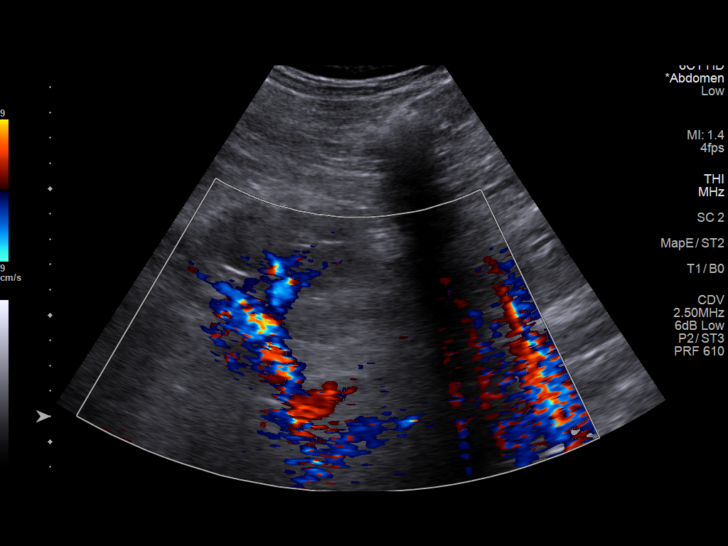
[im 93/132]
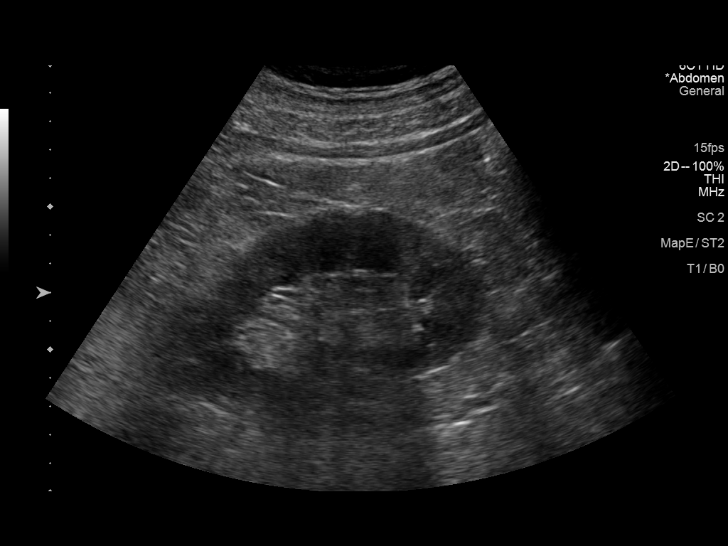
[im 104/132]
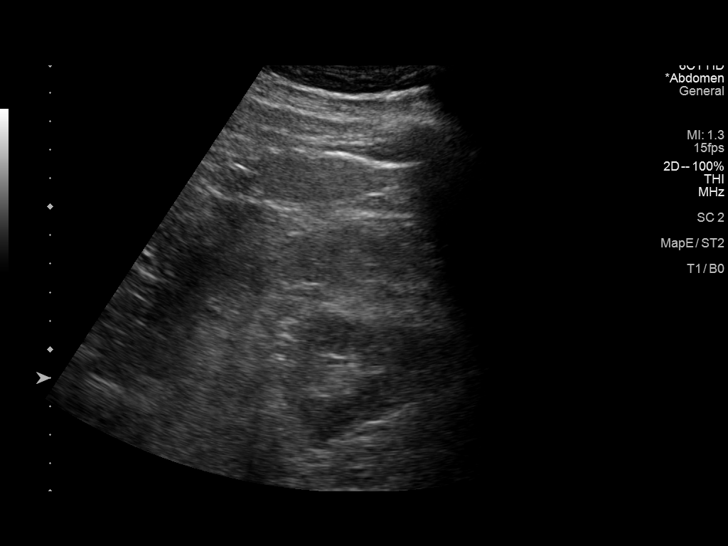
[im 115/132]
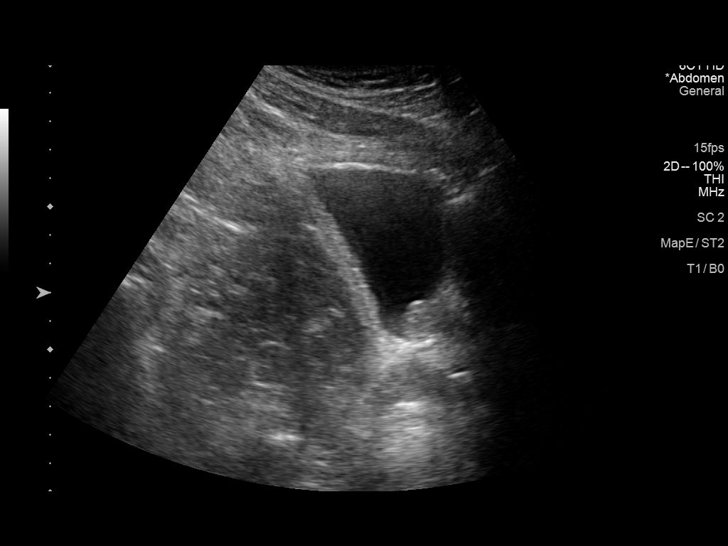
[im 126/132]
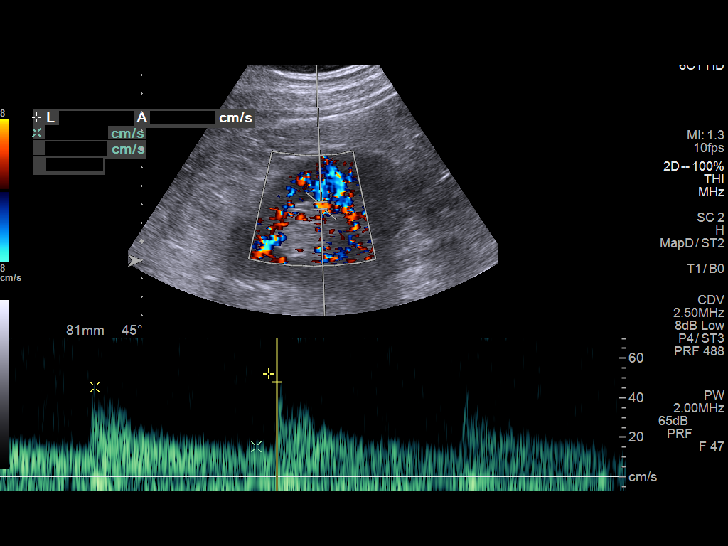

[12 of 25 positions shown; findings below may reference images not displayed]

FINDINGS: Right Kidney:

Normal cortical thickness, echogenicity and size, measuring 12.4 cm
in length. There is an approximately 2.1 x 1.8 x 2.0 cm hypoechoic
partially exophytic lesion arising from the superior pole of the
right kidney (images 45 - 49), not definitely seen on remote
abdominal ultrasound performed in 2882 and cannot be characterized
as a simple renal cyst. Additionally, there is an apparent 1.4 cm
echogenic nodule within the interpolar aspect of the right kidney
(images 50 - 52) which may represent interposed parapelvic fat
though conceivably an angiomyolipoma could have a similar
appearance. Note is made of an approximately 2.2 cm anechoic lesion
involving the inferior pole of the right kidney which is favored to
represent a renal cyst (images 71 through 73). No echogenic renal
stones. No urinary obstruction.

Left Kidney:

Normal cortical thickness, echogenicity and size, measuring 13.3 cm
in length. Note is made of an approximately 1.8 x 1.8 x 1.2 cm
partially exophytic anechoic cyst involving the inferior pole the
left kidney (images 112 through 113). No echogenic renal stones. No
urinary obstruction.

Bladder: There is apparent mass effect of the prostate gland upon
the undersurface of the urinary bladder (images 114 and 119).

_________________________________________________________

RENAL DUPLEX ULTRASOUND

Right Renal Artery Velocities:

Origin:  132 cm/sec

Mid:  122 cm/sec

Hilum:  137 cm/sec

Interlobar:  52 cm/sec

Arcuate:  34 cm/sec

Left Renal Artery Velocities:

Origin:  106 cm/sec

Mid:  53 cm/sec

Hilum:  93 cm/sec

Interlobar:  53 cm/sec

Arcuate:  35 cm/sec

Aortic Velocity:  126 cm/sec

Right Renal-Aortic Ratios:

Origin:

Mid:

Hilum:

Interlobar:

Arcuate:

Left Renal-Aortic Ratios:

Origin:

Mid:

Hilum:

Interlobar:

Arcuate:

Normal velocities and low resistance waveforms are demonstrated
throughout the interrogated portions of the bilateral renal arteries
and renal parenchyma. Bilateral renal veins appear patent.

No evidence of abdominal aortic aneurysm.

There is diffuse increased echogenicity of the incidentally imaged
hepatic parenchyma suggestive hepatic steatosis.
IMPRESSION: 1. No evidence of renal artery stenosis, asymmetric renal atrophy or
urinary obstruction.
2. Indeterminate right-sided renal lesions, not definitely seen on
remote abdominal ultrasound performed [DATE]. Correlation with more
recent prior outside examinations (if available), is advised.
Otherwise, further evaluation could be performed with dedicated
renal MRI as indicated.
3. Potential mass effect of the prostate gland upon the undersurface
of the urinary bladder. If not recently performed, FIJORE is advised.
4. Suspected hepatic steatosis.  Correlation with LFTs is advised.

## 2023-03-09 ENCOUNTER — Other Ambulatory Visit (HOSPITAL_BASED_OUTPATIENT_CLINIC_OR_DEPARTMENT_OTHER): Payer: Self-pay | Admitting: Cardiovascular Disease

## 2023-03-10 NOTE — Telephone Encounter (Signed)
Rx(s) sent to pharmacy electronically.  

## 2023-10-09 ENCOUNTER — Other Ambulatory Visit (HOSPITAL_BASED_OUTPATIENT_CLINIC_OR_DEPARTMENT_OTHER): Payer: Self-pay | Admitting: Cardiovascular Disease

## 2023-10-09 DIAGNOSIS — I10 Essential (primary) hypertension: Secondary | ICD-10-CM

## 2024-07-26 ENCOUNTER — Other Ambulatory Visit: Payer: Self-pay

## 2024-07-26 ENCOUNTER — Other Ambulatory Visit (HOSPITAL_COMMUNITY): Payer: Self-pay

## 2024-07-26 MED ORDER — TESTOSTERONE 50 MG/5GM (1%) TD GEL
5.0000 g | Freq: Every morning | TRANSDERMAL | 5 refills | Status: AC
  Filled 2024-07-26: qty 150, 30d supply, fill #0
  Filled 2024-08-26: qty 150, 30d supply, fill #1
  Filled 2024-09-24: qty 150, 30d supply, fill #2

## 2024-08-27 ENCOUNTER — Other Ambulatory Visit (HOSPITAL_COMMUNITY): Payer: Self-pay

## 2024-09-24 ENCOUNTER — Other Ambulatory Visit: Payer: Self-pay

## 2024-09-25 ENCOUNTER — Other Ambulatory Visit (HOSPITAL_COMMUNITY): Payer: Self-pay

## 2024-09-27 ENCOUNTER — Other Ambulatory Visit (HOSPITAL_COMMUNITY): Payer: Self-pay
